# Patient Record
Sex: Female | Born: 1950 | Race: White | Hispanic: No | Marital: Married | Smoking: Never smoker
Health system: Southern US, Community
[De-identification: ages and names within clinical notes are randomized; demographics above are authoritative.]

## PROBLEM LIST (undated history)

## (undated) DIAGNOSIS — J189 Pneumonia, unspecified organism: Secondary | ICD-10-CM

---

## 2003-02-14 ENCOUNTER — Encounter (INDEPENDENT_AMBULATORY_CARE_PROVIDER_SITE_OTHER): Payer: Self-pay | Admitting: Specialist

## 2003-02-14 ENCOUNTER — Ambulatory Visit (HOSPITAL_COMMUNITY): Admission: RE | Admit: 2003-02-14 | Discharge: 2003-02-14 | Payer: Self-pay | Admitting: Gastroenterology

## 2007-04-05 ENCOUNTER — Encounter: Admission: RE | Admit: 2007-04-05 | Discharge: 2007-04-05 | Payer: Self-pay | Admitting: Internal Medicine

## 2010-07-05 ENCOUNTER — Encounter: Payer: Self-pay | Admitting: Family Medicine

## 2010-10-31 NOTE — Op Note (Signed)
   NAME:  Ashley Gutierrez, Ashley Gutierrez                           ACCOUNT NO.:  [id number]   MEDICAL RECORD NO.:  [id number]                   PATIENT TYPE:  AMB   LOCATION:  ENDO                                 FACILITY:  [facility]   PHYSICIAN:  Graylin Shiver, M.D.                DATE OF BIRTH:  Jul 06, 1950   DATE OF PROCEDURE:  02/14/2003  DATE OF DISCHARGE:                                 OPERATIVE REPORT   PROCEDURE PERFORMED:  Colonoscopy with biopsy.   INDICATIONS FOR PROCEDURE:  Screening.   Informed consent was obtained after explanation of the risks of bleeding,  infection and perforation.   PREMEDICATION:  Fentanyl 100 mcg IV, Versed 8 mg IV.   DESCRIPTION OF PROCEDURE:  With the patient in the left lateral decubitus  position, a rectal exam was performed and no masses were felt.  The Olympus  colonoscope was inserted into the rectum and advanced around the colon to  the cecum.  The cecal landmarks were identified.  I the cecum, there was a  small 3 mm polyp removed with cold forceps.  The ascending colon was normal.  The transverse colon normal.  The descending colon, sigmoid and rectum were  normal.  She tolerated the procedure well without complications.   IMPRESSION:  Small cecal polyp.                                                Graylin Shiver, M.D.    Germain Osgood  D:  02/14/2003  T:  02/14/2003  Job:  [id number]   cc:   Bertram Gala, M.D.

## 2011-06-26 ENCOUNTER — Encounter (HOSPITAL_COMMUNITY): Payer: Self-pay | Admitting: *Deleted

## 2011-06-26 ENCOUNTER — Emergency Department (INDEPENDENT_AMBULATORY_CARE_PROVIDER_SITE_OTHER)

## 2011-06-26 ENCOUNTER — Emergency Department (INDEPENDENT_AMBULATORY_CARE_PROVIDER_SITE_OTHER)
Admission: EM | Admit: 2011-06-26 | Discharge: 2011-06-26 | Disposition: A | Discharge status: Home / Self Care | Source: Home / Self Care | Attending: Emergency Medicine | Admitting: Emergency Medicine

## 2011-06-26 DIAGNOSIS — J159 Unspecified bacterial pneumonia: Secondary | ICD-10-CM

## 2011-06-26 MED ORDER — IBUPROFEN 800 MG PO TABS
ORAL_TABLET | ORAL | Status: AC
  Filled 2011-06-26: qty 1

## 2011-06-26 MED ORDER — TRAMADOL HCL 50 MG PO TABS: 100.0000 mg | ORAL_TABLET | Freq: Three times a day (TID) | ORAL | Status: DC | PRN

## 2011-06-26 MED ORDER — GUAIFENESIN-CODEINE 100-10 MG/5ML PO SYRP: 10.0000 mL | ORAL_SOLUTION | Freq: Four times a day (QID) | ORAL | Status: DC | PRN

## 2011-06-26 MED ORDER — CEFTRIAXONE SODIUM 1 G IJ SOLR
INTRAMUSCULAR | Status: AC
  Filled 2011-06-26: qty 10

## 2011-06-26 MED ORDER — IBUPROFEN 800 MG PO TABS
800.0000 mg | ORAL_TABLET | Freq: Once | ORAL | Status: AC
  Administered 2011-06-26: 800 mg via ORAL

## 2011-06-26 MED ORDER — LIDOCAINE HCL (PF) 1 % IJ SOLN
INTRAMUSCULAR | Status: AC
  Filled 2011-06-26: qty 5

## 2011-06-26 MED ORDER — ALBUTEROL SULFATE HFA 108 (90 BASE) MCG/ACT IN AERS: 1.0000 | INHALATION_SPRAY | Freq: Four times a day (QID) | RESPIRATORY_TRACT | Status: DC | PRN

## 2011-06-26 MED ORDER — CLARITHROMYCIN 500 MG PO TABS: 500.0000 mg | ORAL_TABLET | Freq: Two times a day (BID) | ORAL | Status: DC

## 2011-06-26 MED ORDER — CEFTRIAXONE SODIUM 1 G IJ SOLR
1.0000 g | Freq: Once | INTRAMUSCULAR | Status: AC
  Administered 2011-06-26: 1 g via INTRAMUSCULAR

## 2011-06-26 NOTE — ED Notes (Signed)
Crackers and PO fluids provided.

## 2011-06-26 NOTE — ED Notes (Signed)
Reports having head cold and congestion last week; on Saturday started w/ general malaise and cough.  Fevers up to 102.9 on Sunday.  Had started to improve some over past few days, but now running fevers up to 102 again today.  Has been taking IBU (last dose @ 1400 today), ASA, and Mucinex D.  Denies n/v/d.

## 2011-06-26 NOTE — ED Provider Notes (Signed)
Chief Complaint  Patient presents with  . Cough  . Fever    History of Present Illness:  Ashley Gutierrez has had a one-week history of cough productive of yellow sputum, fever, chills, sweats, aches, pain in the right lateral chest, nasal congestion, rhinorrhea, clear to yellowish drainage with blood and headache. He denies any sore throat. No exposure to influenza. A prior history of pneumonia.  Review of Systems:  Other than noted above, the patient denies any of the following symptoms. Systemic:  No fever, chills, sweats, fatigue, myalgias, headache, or anorexia. Eye:  No redness, pain or drainage. ENT:  No earache, nasal congestion, rhinorrhea, sinus pressure, or sore throat. Lungs:  No cough, sputum production, wheezing, shortness of breath. Or chest pain. GI:  No nausea, vomiting, abdominal pain or diarrhea. Skin:  No rash or itching.  PMFSH:  Past medical history, family history, social history, meds, and allergies were reviewed.  Physical Exam:   Vital signs:  BP 166/94  Pulse 111  Temp(Src) 99.9 F (37.7 C) (Oral)  Resp 24  SpO2 95% General:  Alert, in no distress. Eye:  No conjunctival injection or drainage. ENT:  TMs and canals were normal, without erythema or inflammation.  Nasal mucosa was clear and uncongested, without drainage.  Mucous membranes were moist.  Pharynx was clear, without exudate or drainage.  There were no oral ulcerations or lesions. Neck:  Supple, no adenopathy, tenderness or mass. Lungs:  No respiratory distress. She had bilateral inspiratory and expiratory rhonchi, no wheezes or rales.  Breath sounds were otherwise clear and equal bilaterally. Heart:  Regular rhythm, without gallops, murmers or rubs. Skin:  Clear, warm, and dry, without rash or lesions.   Radiology:  Dg Chest 2 View  06/26/2011  *RADIOLOGY REPORT*  Clinical Data: Cough and fever for 1 week.  CHEST - 2 VIEW  Comparison: None.  Findings: The heart size is normal.  There is dense left lower lobe  opacity, obscuring the medial hemidiaphragm.  There are bilateral pleural effusions, right greater than left.  Minimal right lower lobe atelectasis or infiltrate.  No pulmonary edema.  IMPRESSION:  1.  Left lower lobe infiltrate. 2.  Bilateral effusions.  Original Report Authenticated By: Patterson Hammersmith, M.D.    Medications given in [facility]:  She was given Rocephin 1 g IM and tolerated this well.  Assessment:   Diagnoses that have been ruled out:  None  Diagnoses that are still under consideration:  None  Final diagnoses:  Community acquired bacterial pneumonia      Plan:   1.  The following meds were prescribed:   New Prescriptions   ALBUTEROL (PROVENTIL HFA;VENTOLIN HFA) 108 (90 BASE) MCG/ACT INHALER    Inhale 1-2 puffs into the lungs every 6 (six) hours as needed for wheezing.   CLARITHROMYCIN (BIAXIN) 500 MG TABLET    Take 1 tablet (500 mg total) by mouth 2 (two) times daily.   TRAMADOL (ULTRAM) 50 MG TABLET    Take 2 tablets (100 mg total) by mouth every 8 (eight) hours as needed for pain.   2.  The patient was instructed in symptomatic care and handouts were given. 3.  The patient was told to return if becoming worse in any way, if no better in 3 or 4 days, and given some red flag symptoms that would indicate earlier return. 4.   She was told to return in 48 hours for recheck. If she should become worse in the meantime I suggested she go straight  to the emergency room.   Roque Lias, MD 06/26/11 [phone]

## 2011-06-29 ENCOUNTER — Emergency Department (INDEPENDENT_AMBULATORY_CARE_PROVIDER_SITE_OTHER)
Admission: EM | Admit: 2011-06-29 | Discharge: 2011-06-29 | Disposition: A | Discharge status: Home / Self Care | Source: Home / Self Care | Attending: Family Medicine | Admitting: Family Medicine

## 2011-06-29 ENCOUNTER — Encounter (HOSPITAL_COMMUNITY): Payer: Self-pay

## 2011-06-29 ENCOUNTER — Telehealth: Payer: Self-pay | Admitting: Emergency Medicine

## 2011-06-29 ENCOUNTER — Emergency Department (INDEPENDENT_AMBULATORY_CARE_PROVIDER_SITE_OTHER)

## 2011-06-29 DIAGNOSIS — J189 Pneumonia, unspecified organism: Secondary | ICD-10-CM

## 2011-06-29 HISTORY — DX: Pneumonia, unspecified organism: J18.9

## 2011-06-29 LAB — DIFFERENTIAL
Basophils Absolute: 0 10*3/uL (ref 0.0–0.1)
Basophils Relative: 0 % (ref 0–1)
Eosinophils Absolute: 0 10*3/uL (ref 0.0–0.7)
Eosinophils Relative: 0 % (ref 0–5)
Lymphocytes Relative: 8 % — ABNORMAL LOW (ref 12–46)
Lymphs Abs: 0.9 10*3/uL (ref 0.7–4.0)
Monocytes Absolute: 0.5 10*3/uL (ref 0.1–1.0)
Monocytes Relative: 5 % (ref 3–12)
Neutro Abs: 9.8 10*3/uL — ABNORMAL HIGH (ref 1.7–7.7)
Neutrophils Relative %: 87 % — ABNORMAL HIGH (ref 43–77)

## 2011-06-29 LAB — CBC
HCT: 40.4 % (ref 36.0–46.0)
Hemoglobin: 14 g/dL (ref 12.0–15.0)
MCH: 30.1 pg (ref 26.0–34.0)
MCHC: 34.7 g/dL (ref 30.0–36.0)
MCV: 86.9 fL (ref 78.0–100.0)
Platelets: 437 10*3/uL — ABNORMAL HIGH (ref 150–400)
RBC: 4.65 MIL/uL (ref 3.87–5.11)
RDW: 12.3 % (ref 11.5–15.5)
WBC: 11.2 10*3/uL — ABNORMAL HIGH (ref 4.0–10.5)

## 2011-06-29 MED ORDER — OSELTAMIVIR PHOSPHATE 75 MG PO CAPS: 75.0000 mg | ORAL_CAPSULE | Freq: Two times a day (BID) | ORAL | Status: AC

## 2011-06-29 MED ORDER — MOXIFLOXACIN HCL 400 MG PO TABS: 400.0000 mg | ORAL_TABLET | Freq: Every day | ORAL | Status: AC

## 2011-06-29 NOTE — Telephone Encounter (Signed)
Per Lawson Fiscal she will speak with RB regarding this

## 2011-06-29 NOTE — ED Provider Notes (Signed)
History     CSN: [id number]  Arrival date & time 06/29/11  1059   First MD Initiated Contact with Patient 06/29/11 1314      Chief Complaint  Patient presents with  . Follow-up    (Consider location/radiation/quality/duration/timing/severity/associated sxs/prior treatment) Patient is a 61 y.o. female presenting with cough. The history is provided by the patient and the spouse.  Cough This is a new problem. The current episode started more than 2 days ago (seen 1/11 for pneumonia, pt not feeling much better but fever and chills gone, still cough, decreased po, , sob.). The problem occurs constantly. The problem has not changed since onset.The cough is productive of purulent sputum. There has been no fever. Associated symptoms include chest pain and shortness of breath. Pertinent negatives include no chills and no sore throat. She is not a smoker. Her past medical history is significant for pneumonia.    Past Medical History  Diagnosis Date  . Pneumonia     History reviewed. No pertinent past surgical history.  No family history on file.  History  Substance Use Topics  . Smoking status: Never Smoker   . Smokeless tobacco: Not on file  . Alcohol Use: No    OB History    Grav Para Term Preterm Abortions TAB SAB Ect Mult Living                  Review of Systems  Constitutional: Positive for appetite change. Negative for fever and chills.  HENT: Negative for sore throat.   Respiratory: Positive for cough and shortness of breath.   Cardiovascular: Positive for chest pain.  Gastrointestinal: Positive for nausea.    Allergies  Review of patient's allergies indicates no known allergies.  Home Medications   Current Outpatient Rx  Name Route Sig Dispense Refill  . ALBUTEROL SULFATE HFA 108 (90 BASE) MCG/ACT IN AERS Inhalation Inhale 1-2 puffs into the lungs every 6 (six) hours as needed for wheezing. 1 Inhaler 0  . TRAMADOL HCL 50 MG PO TABS Oral Take 2 tablets (100 mg  total) by mouth every 8 (eight) hours as needed for pain. 30 tablet 0  . MOXIFLOXACIN HCL 400 MG PO TABS Oral Take 1 tablet (400 mg total) by mouth daily. 7 tablet 0  . OSELTAMIVIR PHOSPHATE 75 MG PO CAPS Oral Take 1 capsule (75 mg total) by mouth every 12 (twelve) hours. 10 capsule 0    BP 150/89  Pulse 100  Temp(Src) 98.4 F (36.9 C) (Oral)  Resp 18  SpO2 96%  Physical Exam  Nursing note and vitals reviewed. Constitutional: She appears well-developed and well-nourished. No distress.  Neck: Normal range of motion. Neck supple.  Cardiovascular: Normal rate, regular rhythm, normal heart sounds and intact distal pulses.   Pulmonary/Chest: She has decreased breath sounds in the right lower field, the left middle field and the left lower field. She has rales in the right lower field, the left middle field and the left lower field. She exhibits tenderness.  Lymphadenopathy:    She has no cervical adenopathy.    ED Course  Procedures (including critical care time)  Labs Reviewed  CBC - Abnormal; Notable for the following:    WBC 11.2 (*)    Platelets 437 (*)    All other components within normal limits  DIFFERENTIAL - Abnormal; Notable for the following:    Neutrophils Relative 87 (*)    Neutro Abs 9.8 (*)    Lymphocytes Relative 8 (*)  All other components within normal limits   Dg Chest 2 View  06/29/2011  *RADIOLOGY REPORT*  Clinical Data: Pneumonia.  Clinical worsening.  CHEST - 2 VIEW  Comparison: 06/26/2011  Findings: Infiltrate and collapse in the left lower lobe appear quite similar.  The left upper lobe is clear.  There may be mild patchy involvement at the right base.  Alternatively, findings at the right base could relate to scarring.  No new or progressive findings radiographically.  IMPRESSION: No radiographic change.  Left lower lobe pneumonia and volume loss. Mild patchy density at the right base could be scarring or minimal involvement in the right lower lobe as well.   Original Report Authenticated By: Thomasenia Sales, M.D.     1. CAP (community acquired pneumonia)       MDM  X-rays reviewed and report per radiologist.  Plan per [facility] pulm.        Barkley Bruns, MD 06/29/11 [phone]

## 2011-06-29 NOTE — ED Notes (Signed)
Pt seen here on Friday and diagnosed with pneumonia.  States she doesn't feel much better- still coughing and feels weak.  States she hasn't been eating and has been trying to drink fluids.  Still coughing but denies fever or chills.

## 2011-07-02 NOTE — Telephone Encounter (Signed)
Dr. Delton Coombes does not recall speaking with Dr. Artis Flock regarding this patient. She will need to be scheduled for first available consult with any provider if still needing appointment.

## 2011-07-02 NOTE — Telephone Encounter (Signed)
Pt returned call. I have scheduled her to see MW tomorrow at 3:30 (tried for the 1:30 slot but she can't come until spouse gets off of work). Ashley Gutierrez

## 2011-07-02 NOTE — Telephone Encounter (Signed)
ATC pt at home # to offer her a pulmonary consult appt with first avail doc (MW added office tomorrow afternoon so I was going to offer her a consult appt tomorrow with MW)  However, family member picked up the phone to hand it to pt and she hung up on me.  Will try back again later today.

## 2011-07-03 ENCOUNTER — Ambulatory Visit (INDEPENDENT_AMBULATORY_CARE_PROVIDER_SITE_OTHER): Admitting: Internal Medicine

## 2011-07-03 ENCOUNTER — Encounter: Payer: Self-pay | Admitting: Internal Medicine

## 2011-07-03 VITALS — BP 156/92 | HR 90 | Temp 98.3°F | Ht 67.0 in | Wt 123.6 lb

## 2011-07-03 DIAGNOSIS — J189 Pneumonia, unspecified organism: Secondary | ICD-10-CM

## 2011-07-03 NOTE — Patient Instructions (Signed)
Return on Jan 30th for follow up cxr - call sooner if condition worsens.

## 2011-07-03 NOTE — Assessment & Plan Note (Signed)
cxr shows minimal vol loss (which would be more of a concern if she were a smoker) and very little pleural effusion, which suggests risk of late parapneumic process; however, I am hopeful that this is nothing more than a CAP with a classic cxr lag.  Discussed in detail all the  indications, usual  risks and alternatives  relative to the benefits with patient who agrees to proceed with conservative rx in the absence of any flare of symptoms with a final cxr on 07/15/11 (placed in tickle file for this purpose)

## 2011-07-03 NOTE — Progress Notes (Signed)
  Subjective:    Patient ID: Ashley Gutierrez, female    DOB: [birth year]   MRN: [id number]  HPI  54 yowf never smoker never resp problems until christmas 2012 with cold evolving to severe cough and cp and dx of pna referred by Dr Alanson Puls 07/03/2011 for pna.  07/03/2011 1s pulmonary eval cc acute onset cold like symptoms around Christmas 2012 then severe cough then fever to 102 the first week in January seen in [facility] rx inhaler, biaxin, shot, pain killers with worsening bilateral cp really no better (x fever gone)  then changed 1/14 to avelox and tamiflu and 3 days Prior to day of OV  > recovered x a bit weak still. Pain is better, fever gone, no cough. No arthralgias, appetite returning.   Sleeping ok without nocturnal  or early am exacerbation  of respiratory  C/o's . Also denies any obvious fluctuation of symptoms with weather or environmental changes or other aggravating or alleviating factors except as outlined above    Review of Systems  Constitutional: Positive for fever and unexpected weight change.  HENT: Positive for congestion, rhinorrhea and postnasal drip. Negative for ear pain, nosebleeds, sore throat, sneezing, trouble swallowing, dental problem and sinus pressure.   Eyes: Negative for redness and itching.  Respiratory: Positive for cough. Negative for chest tightness, shortness of breath and wheezing.   Cardiovascular: Negative for palpitations and leg swelling.  Gastrointestinal: Negative for nausea and vomiting.  Genitourinary: Negative for dysuria.  Musculoskeletal: Negative for joint swelling.  Skin: Positive for rash.  Neurological: Negative for headaches.  Hematological: Does not bruise/bleed easily.  Psychiatric/Behavioral: Negative for dysphoric mood. The patient is not nervous/anxious.        Objective:   Physical Exam  Pleasant amb wf nad  Wt 123 07/03/2011   HEENT: nl dentition, turbinates, and orophanx. Nl external ear canals without cough reflex   NECK :   without JVD/Nodes/TM/ nl carotid upstrokes bilaterally   LUNGS: no acc muscle use, decreased bs both bases without bronchial changes or crackles  CV:  RRR  no s3 or murmur or increase in P2, no edema   ABD:  soft and nontender with nl excursion in the supine position. No bruits or organomegaly, bowel sounds nl  MS:  warm without deformities, calf tenderness, cyanosis or clubbing  SKIN: warm and dry without lesions    NEURO:  alert, approp, no deficits    06/29/11 CXR No radiographic change. Left lower lobe pneumonia and volume loss.  Mild patchy density at the right base could be scarring or minimal  involvement in the right lower lobe as well.     Assessment & Plan:

## 2011-07-15 ENCOUNTER — Ambulatory Visit: Admitting: Internal Medicine

## 2011-07-16 ENCOUNTER — Ambulatory Visit (INDEPENDENT_AMBULATORY_CARE_PROVIDER_SITE_OTHER)
Admission: RE | Admit: 2011-07-16 | Discharge: 2011-07-16 | Disposition: A | Discharge status: Home / Self Care | Source: Ambulatory Visit | Attending: Internal Medicine | Admitting: Internal Medicine

## 2011-07-16 ENCOUNTER — Ambulatory Visit (INDEPENDENT_AMBULATORY_CARE_PROVIDER_SITE_OTHER): Admitting: Internal Medicine

## 2011-07-16 ENCOUNTER — Encounter: Payer: Self-pay | Admitting: Internal Medicine

## 2011-07-16 VITALS — BP 150/86 | HR 76 | Temp 98.0°F | Ht 67.0 in | Wt 123.0 lb

## 2011-07-16 DIAGNOSIS — J189 Pneumonia, unspecified organism: Secondary | ICD-10-CM

## 2011-07-16 NOTE — Progress Notes (Signed)
  Subjective:    Patient ID: Ashley Gutierrez, female    DOB: 06-26-1950   MRN: [id number]  HPI  Brief patient profile:  73 yowf never smoker never resp problems until christmas 2012 with cold evolving to severe cough and cp and dx of pna referred by Dr Alanson Puls 07/03/2011 for pna.  07/03/2011 1s pulmonary eval cc acute onset cold like symptoms around Christmas 2012 then severe cough then fever to 102 the first week in January seen in [facility] rx inhaler, biaxin, shot, pain killers with worsening bilateral cp really no better (x fever gone)  then changed 1/14 to avelox and tamiflu and 3 days Prior to day of OV  > recovered x a bit weak still. Pain is better, fever gone, no cough. No arthralgias, appetite returning.  rec Finish all your abx Return on Jan 30th for follow up cxr - call sooner if condition worsens   07/16/2011 f/u ov/Athanasia Stanwood cc no cough at all, some discomfort with deep breathing bilaterally laterally but no sob   Sleeping ok without nocturnal  or early am exacerbation  of respiratory  C/o's . Also denies any obvious fluctuation of symptoms with weather or environmental changes or other aggravating or alleviating factors except as outlined above.  ROS  At present neg for  any significant sore throat, dysphagia, itching, sneezing,  nasal congestion or excess/ purulent secretions,  fever, chills, sweats, unintended wt loss,  exertional cp, hempoptysis, orthopnea pnd or leg swelling.  Also denies presyncope, palpitations, heartburn, abdominal pain, nausea, vomiting, diarrhea  or change in bowel or urinary habits, dysuria,hematuria,  rash, arthralgias, visual complaints, headache, numbness weakness or ataxia.            Objective:   Physical Exam  Pleasant amb wf nad   Wt 123 07/03/2011 > 07/16/2011  123   HEENT: nl dentition, turbinates, and orophanx. Nl external ear canals without cough reflex   NECK :  without JVD/Nodes/TM/ nl carotid upstrokes bilaterally   LUNGS: no acc muscle use,  decreased bs both bases without bronchial changes or crackles  CV:  RRR  no s3 or murmur or increase in P2, no edema   ABD:  soft and nontender with nl excursion in the supine position. No bruits or organomegaly, bowel sounds nl  MS:  warm without deformities, calf tenderness, cyanosis or clubbing      06/29/11 CXR No radiographic change. Left lower lobe pneumonia and volume loss.  Mild patchy density at the right base could be scarring or minimal  involvement in the right lower lobe as well.     Assessment & Plan:

## 2011-07-16 NOTE — Patient Instructions (Signed)
Return here if not staying 100% better  Classic subdiaphragmatic pain pattern suggests ibs:  Stereotypical, migratory with a very limited distribution of pain locations, daytime, not exacerbated by ex or coughing, worse in sitting position, associated with generalized abd bloating, not present supine due to the dome effect of the diaphragm is  canceled in that position. Frequently these patients have had multiple negative GI workups and CT scans.  Treatment consists of avoiding foods that cause gas (especially beans and raw vegetables like spinach and salads)  and citrucel 1 heaping tsp twice daily with a large glass of water.  Pain should improve w/in 2 weeks and if not then consider further GI work up.   Gas X also may help.

## 2011-07-17 ENCOUNTER — Encounter: Payer: Self-pay | Admitting: Internal Medicine

## 2011-07-17 NOTE — Assessment & Plan Note (Addendum)
Marked clinical and radiographic improvement p appropriate rx for pna though clinical course a bit longer than usual.  She has some vague lower chest discomfort which may be nothing more than ibs but certainly needs further evaluation if not resolving over the next few weeks.  She is a never smoker so as long as all her symptoms completely resolved there's no need for f/u at this point

## 2011-07-22 ENCOUNTER — Telehealth: Payer: Self-pay | Admitting: *Deleted

## 2011-07-22 NOTE — Telephone Encounter (Signed)
Error Pt has already had cxr done 07/15/10 and the results were discussed at ov.

## 2011-07-22 NOTE — Telephone Encounter (Signed)
Message copied by Christen Butter on Wed Jul 22, 2011  3:26 PM ------      Message from: Nyoka Cowden      Created: Fri Jul 03, 2011  4:17 PM       Needs cxr by now

## 2011-07-27 ENCOUNTER — Telehealth: Payer: Self-pay | Admitting: Internal Medicine

## 2011-07-27 NOTE — Telephone Encounter (Signed)
More likely chest wall muscle sprain than rib fx but no additional treatment needed for either unless pain gets worse but would need ov if this is the case

## 2011-07-27 NOTE — Telephone Encounter (Signed)
Pt advised of recs.Jennifer Castillo, CMA  

## 2011-07-27 NOTE — Telephone Encounter (Signed)
Called and spoke with pt.  Pt states she was seen by MW on 07/16/11 for f/u PNA and had cxr done same day.  Pt states she is still having Bilat "soreness" across her chest/ribs.  States left side of chest is worse- sore to the touch.  Pt is wondering if this could still be the pna or maybe a cracked rib.  MW, please advise.  Thanks!!

## 2015-03-26 DIAGNOSIS — Z86010 Personal history of colonic polyps: Secondary | ICD-10-CM | POA: Diagnosis not present

## 2015-03-26 DIAGNOSIS — Z09 Encounter for follow-up examination after completed treatment for conditions other than malignant neoplasm: Secondary | ICD-10-CM | POA: Diagnosis not present

## 2015-03-26 LAB — HM COLONOSCOPY

## 2016-04-29 ENCOUNTER — Ambulatory Visit (INDEPENDENT_AMBULATORY_CARE_PROVIDER_SITE_OTHER): Payer: Medicare PPO | Admitting: Family Medicine

## 2016-04-29 ENCOUNTER — Encounter: Payer: Self-pay | Admitting: Family Medicine

## 2016-04-29 DIAGNOSIS — Z83438 Family history of other disorder of lipoprotein metabolism and other lipidemia: Secondary | ICD-10-CM | POA: Insufficient documentation

## 2016-04-29 DIAGNOSIS — E559 Vitamin D deficiency, unspecified: Secondary | ICD-10-CM | POA: Diagnosis not present

## 2016-04-29 DIAGNOSIS — E785 Hyperlipidemia, unspecified: Secondary | ICD-10-CM | POA: Diagnosis not present

## 2016-04-29 DIAGNOSIS — Z8371 Family history of colonic polyps: Secondary | ICD-10-CM

## 2016-04-29 DIAGNOSIS — R030 Elevated blood-pressure reading, without diagnosis of hypertension: Secondary | ICD-10-CM | POA: Diagnosis not present

## 2016-04-29 DIAGNOSIS — Z8249 Family history of ischemic heart disease and other diseases of the circulatory system: Secondary | ICD-10-CM | POA: Diagnosis not present

## 2016-04-29 DIAGNOSIS — Z8349 Family history of other endocrine, nutritional and metabolic diseases: Secondary | ICD-10-CM | POA: Diagnosis not present

## 2016-04-29 NOTE — Progress Notes (Addendum)
New patient office visit note:  Impression and Recommendations:    1. Elevated blood pressure reading without diagnosis of hypertension   2. Family history of hypertension   3. Family history of hyperlipidemia   4. Family history of colonic polyps    -  health counseling done;   -  advised pt to monitor BP at home and keep a log.  F/up sooner than planned if BP remains above goal of UNDER 130/80 on regular basis  -   Handouts and educational materials given to pt.   -  told pt she can f/up for discussion of labs if she wants to; o/w we will have my assistant call her with results.   - f/up 60mo needed minimum for CPE   Orders Placed This Encounter  Procedures  . COMPLETE METABOLIC PANEL WITH GFR  . CBC with Differential/Platelet  . Hemoglobin A1c  . Lipid panel  . T4, free  . TSH  . VITAMIN D 25 Hydroxy (Vit-D Deficiency, Fractures)  . Vitamin B12   The patient was counseled, risk factors were discussed, anticipatory guidance given.  Return for fasting BW will be done today;  f/up if desired to discuss it; o/w yrly PE needed.  Please see AVS handed out to patient at the end of our visit for further patient instructions/ counseling done pertaining to today's office visit.    Note: This document was prepared using Dragon voice recognition software and may include unintentional dictation errors.  ----------------------------------------------------------------------------------------------------------------------    Subjective:    Chief Complaint  Patient presents with  . Establish Care    HPI: Ashley Gutierrez is a pleasant 65 y.o. female who presents to [place] [facility] today to review their medical history with me and establish care.   I asked the patient to review their chronic problem list with me to ensure everything was updated and accurate.    Prior PCP- Dr Glendale Chard- [facility]  Retired- used to work in [employer], husband  in [employer]  Married 57 yrs- [place].  1 kid (57yo, 4 Grandkids)  She helps takes care of them as well. [place]ived here 68yrs now.   [place] 4 [place]- about 38min.  Never smoked, no etoh, no drugs, not currently sexually active with husband.  Mom - HTN, HLD lived til age 40.   Never really goes to the doctor- no yrly screenings etc.  Had lifeline screening in Feb 2016.   GYn- none, doesn't know when last exam was- has no clue.   Last mammo- 2008, is not worried about it and understands yrly recs.  Was told Bp was a "little high in past", never been on meds and doesn't want them.       Wt Readings from Last 3 Encounters:  04/29/16 129 lb (58.5 kg)  07/16/11 123 lb (55.8 kg)  07/03/11 123 lb 9.6 oz (56.1 kg)   BP Readings from Last 3 Encounters:  04/29/16 (!) 162/82  07/16/11 (!) 150/86  07/03/11 (!) 156/92   Pulse Readings from Last 3 Encounters:  04/29/16 79  07/16/11 76  07/03/11 90   BMI Readings from Last 3 Encounters:  04/29/16 20.20 kg/m  07/16/11 19.26 kg/m  07/03/11 19.36 kg/m    Patient Care Team    Relationship Specialty Notifications Start End  Mellody Dance, DO PCP - General Family Medicine  04/29/16   Wonda Horner, MD Consulting Physician Gastroenterology  04/29/16   Tanda Rockers, MD Consulting Physician Pulmonary Disease  04/29/16     Patient Active Problem List   Diagnosis Date Noted  . Elevated blood pressure reading without diagnosis of hypertension 04/29/2016    Priority: High  . Family history of hypertension 04/29/2016  . Family history of hyperlipidemia 04/29/2016  . Family history of colonic polyps 04/29/2016     Past Medical History:  Diagnosis Date  . Pneumonia      Past Medical History:  Diagnosis Date  . Pneumonia      History reviewed. No pertinent surgical history.   Family History  Problem Relation Age of Onset  . Diabetes Mother   . Hypertension Mother   . Hyperlipidemia Mother      History  Drug Use  No    History  Alcohol Use No    History  Smoking Status  . Never Smoker  Smokeless Tobacco  . Never Used     Patient's Medications  New Prescriptions   No medications on file  Previous Medications   MULTIPLE VITAMIN (MULTIVITAMIN) CAPSULE    Take 1 capsule by mouth daily.   VITAMIN C (ASCORBIC ACID) 500 MG TABLET    Take 1,000 mg by mouth daily.   Modified Medications   No medications on file  Discontinued Medications   No medications on file    Allergies: Patient has no known allergies.  Review of Systems  Constitutional: Negative.  Negative for chills, diaphoresis, fever, malaise/fatigue and weight loss.  HENT: Negative.  Negative for congestion, sore throat and tinnitus.   Eyes: Negative.  Negative for blurred vision, double vision and photophobia.  Respiratory: Negative.  Negative for cough and wheezing.   Cardiovascular: Negative.  Negative for chest pain and palpitations.  Gastrointestinal: Negative.  Negative for blood in stool, diarrhea, nausea and vomiting.  Genitourinary: Negative.  Negative for dysuria, frequency and urgency.  Musculoskeletal: Negative.  Negative for joint pain and myalgias.  Skin: Negative.  Negative for itching and rash.  Neurological: Negative.  Negative for dizziness, focal weakness, weakness and headaches.  Endo/Heme/Allergies: Negative.  Negative for environmental allergies and polydipsia. Does not bruise/bleed easily.  Psychiatric/Behavioral: Negative.  Negative for depression and memory loss. The patient is not nervous/anxious and does not have insomnia.      Objective:    Blood pressure (!) 162/82, pulse 79, resp. rate 16, weight 129 lb (58.5 kg), SpO2 98 %. Body mass index is 20.2 kg/m. General: Well Developed, well nourished, and in no acute distress.  Neuro: Alert and oriented x3, extra-ocular muscles intact, sensation grossly intact.  HEENT: Normocephalic, atraumatic, pupils equal round reactive to light, neck supple, no  bruits Skin: no gross rashes  Cardiac: Regular rate and rhythm Respiratory: Essentially clear to auscultation bilaterally. Not using accessory muscles, speaking in full sentences.  Abdominal: not grossly distended Musculoskeletal: Ambulates w/o diff, FROM * 4 ext.  Vasc: less 2 sec cap RF, warm and pink  Psych:  No HI/SI, judgement and insight good, Euthymic mood. Full Affect.

## 2016-04-29 NOTE — Patient Instructions (Addendum)
Since her blood pressure is up today, please do some home blood pressure monitoring. Check your blood pressure after you been sitting for about 15-20 minutes a can check at random times during the day. We don't have to check it every day but just we wanted know what it is on average. Normal blood pressure should always be in the 120s over 70s or less. If it's 130/80 or more on a regular basis please return to the clinic sooner than planned so we can talk about treatment.    Hypertension Hypertension, commonly called high blood pressure, is when the force of blood pumping through your arteries is too strong. Your arteries are the blood vessels that carry blood from your heart throughout your body. A blood pressure reading consists of a higher number over a lower number, such as 110/72. The higher number (systolic) is the pressure inside your arteries when your heart pumps. The lower number (diastolic) is the pressure inside your arteries when your heart relaxes. Ideally you want your blood pressure below 120/80. Hypertension forces your heart to work harder to pump blood. Your arteries may become narrow or stiff. Having untreated or uncontrolled hypertension can cause heart attack, stroke, kidney disease, and other problems. What increases the risk? Some risk factors for high blood pressure are controllable. Others are not. Risk factors you cannot control include: Race. You may be at higher risk if you are African American. Age. Risk increases with age. Gender. Men are at higher risk than women before age 44 years. After age 72, women are at higher risk than men. Risk factors you can control include: Not getting enough exercise or physical activity. Being overweight. Getting too much fat, sugar, calories, or salt in your diet. Drinking too much alcohol. What are the signs or symptoms? Hypertension does not usually cause signs or symptoms. Extremely high blood pressure (hypertensive crisis) may  cause headache, anxiety, shortness of breath, and nosebleed. How is this diagnosed? To check if you have hypertension, your health care provider will measure your blood pressure while you are seated, with your arm held at the level of your heart. It should be measured at least twice using the same arm. Certain conditions can cause a difference in blood pressure between your right and left arms. A blood pressure reading that is higher than normal on one occasion does not mean that you need treatment. If it is not clear whether you have high blood pressure, you may be asked to return on a different day to have your blood pressure checked again. Or, you may be asked to monitor your blood pressure at home for 1 or more weeks. How is this treated? Treating high blood pressure includes making lifestyle changes and possibly taking medicine. Living a healthy lifestyle can help lower high blood pressure. You may need to change some of your habits. Lifestyle changes may include: Following the DASH diet. This diet is high in fruits, vegetables, and whole grains. It is low in salt, red meat, and added sugars. Keep your sodium intake below 2,300 mg per day. Getting at least 30-45 minutes of aerobic exercise at least 4 times per week. Losing weight if necessary. Not smoking. Limiting alcoholic beverages. Learning ways to reduce stress. Your health care provider may prescribe medicine if lifestyle changes are not enough to get your blood pressure under control, and if one of the following is true: You are 55-20 years of age and your systolic blood pressure is above 140. You are 60  years of age or older, and your systolic blood pressure is above 150. Your diastolic blood pressure is above 90. You have diabetes, and your systolic blood pressure is over [id number] or your diastolic blood pressure is over 90. You have kidney disease and your blood pressure is above 140/90. You have heart disease and your blood pressure is  above 140/90. Your personal target blood pressure may vary depending on your medical conditions, your age, and other factors. Follow these instructions at home: Have your blood pressure rechecked as directed by your health care provider. Take medicines only as directed by your health care provider. Follow the directions carefully. Blood pressure medicines must be taken as prescribed. The medicine does not work as well when you skip doses. Skipping doses also puts you at risk for problems. Do not smoke. Monitor your blood pressure at home as directed by your health care provider. Contact a health care provider if: You think you are having a reaction to medicines taken. You have recurrent headaches or feel dizzy. You have swelling in your ankles. You have trouble with your vision. Get help right away if: You develop a severe headache or confusion. You have unusual weakness, numbness, or feel faint. You have severe chest or abdominal pain. You vomit repeatedly. You have trouble breathing. This information is not intended to replace advice given to you by your health care provider. Make sure you discuss any questions you have with your health care provider. Document Released: 06/01/2005 Document Revised: 11/07/2015 Document Reviewed: 03/24/2013 Elsevier Interactive Patient Education  2017 [place] NOTE:  The office will be calling if your labs / recent test results are not within acceptable normal values within one week of them being done.    Furthermore, you'll be able to review all of your results in "My Chart," when they become available; so please sign-up if you have not already done so.   Keep a copy of these results in your personal home file to share with your other physicians as warranted. If you have any further questions or concerns please do not hesitate to contact us.   Thank you and we appreciate you choosing [place] as your primary care provider.   - 'the Team'  at [place]     Please realize, EXERCISE IS MEDICINE!  -  American Heart Association [facility]) guidelines for exercise : If you are in good health, without any medical conditions, you should engage in 150 minutes of moderate intensity aerobic activity per week.  This means you should be huffing and puffing throughout your workout.   Engaging in regular exercise will improve brain function and memory, as well as improve mood, boost immune system and help with weight management.  As well as the other, more well-known effects of exercise such as decreasing blood sugar levels, decreasing blood pressure,  and decreasing bad cholesterol levels/ increasing good cholesterol levels.     -  The AHA strongly endorses consumption of a diet that contains a variety of foods from all the food categories with an emphasis on fruits and vegetables; fat-free and low-fat dairy products; cereal and grain products; legumes and nuts; and fish, poultry, and/or extra lean meats.    Excessive food intake, especially of foods high in saturated and trans fats, sugar, and salt, should be avoided.    Adequate water intake of roughly 1/2 of your weight in pounds, should equal the ounces of water per day you should  drink.  So for instance, if you're 200 pounds, that would be 100 ounces of water per day.         Mediterranean Diet  Why follow it? Research shows. . Those who follow the Mediterranean diet have a reduced risk of heart disease  . The diet is associated with a reduced incidence of Parkinson's and Alzheimer's diseases . People following the diet may have longer life expectancies and lower rates of chronic diseases  . The Dietary Guidelines for Americans recommends the Mediterranean diet as an eating plan to promote health and prevent disease  What Is the Mediterranean Diet?  . Healthy eating plan based on typical foods and recipes of Mediterranean-style cooking . The diet is primarily a plant based  diet; these foods should make up a majority of meals   Starches - Plant based foods should make up a majority of meals - They are an important sources of vitamins, minerals, energy, antioxidants, and fiber - Choose whole grains, foods high in fiber and minimally processed items  - Typical grain sources include wheat, oats, barley, corn, brown rice, bulgar, farro, millet, polenta, couscous  - Various types of beans include chickpeas, lentils, fava beans, black beans, white beans   Fruits  Veggies - Large quantities of antioxidant rich fruits & veggies; 6 or more servings  - Vegetables can be eaten raw or lightly drizzled with oil and cooked  - Vegetables common to the traditional Mediterranean Diet include: artichokes, arugula, beets, broccoli, brussel sprouts, cabbage, carrots, celery, collard greens, cucumbers, eggplant, kale, leeks, lemons, lettuce, mushrooms, okra, onions, peas, peppers, potatoes, pumpkin, radishes, rutabaga, shallots, spinach, sweet potatoes, turnips, zucchini - Fruits common to the Mediterranean Diet include: apples, apricots, avocados, cherries, clementines, dates, figs, grapefruits, grapes, melons, nectarines, oranges, peaches, pears, pomegranates, strawberries, tangerines  Fats - Replace butter and margarine with healthy oils, such as olive oil, canola oil, and tahini  - Limit nuts to no more than a handful a day  - Nuts include walnuts, almonds, pecans, pistachios, pine nuts  - Limit or avoid candied, honey roasted or heavily salted nuts - Olives are central to the [employer] - can be eaten whole or used in a variety of dishes   Meats Protein - Limiting red meat: no more than a few times a month - When eating red meat: choose lean cuts and keep the portion to the size of deck of cards - Eggs: approx. 0 to 4 times a week  - Fish and lean poultry: at least 2 a week  - Healthy protein sources include, chicken, [place], lean beef, lamb - Increase intake of seafood  such as tuna, salmon, trout, mackerel, shrimp, scallops - Avoid or limit high fat processed meats such as sausage and bacon  Dairy - Include moderate amounts of low fat dairy products  - Focus on healthy dairy such as fat free yogurt, skim milk, low or reduced fat cheese - Limit dairy products higher in fat such as whole or 2% milk, cheese, ice cream  Alcohol - Moderate amounts of red wine is ok  - No more than 5 oz daily for women (all ages) and men older than age 37  - No more than 10 oz of wine daily for men younger than 67  Other - Limit sweets and other desserts  - Use herbs and spices instead of salt to flavor foods  - Herbs and spices common to the traditional Mediterranean Diet include: basil, bay leaves, chives, cloves, cumin,  fennel, garlic, lavender, marjoram, mint, oregano, parsley, pepper, rosemary, sage, savory, sumac, tarragon, thyme   It's not just a diet, it's a lifestyle:  . The Mediterranean diet includes lifestyle factors typical of those in the region  . Foods, drinks and meals are best eaten with others and savored . Daily physical activity is important for overall good health . This could be strenuous exercise like running and aerobics . This could also be more leisurely activities such as walking, housework, yard-work, or taking the stairs . Moderation is the key; a balanced and healthy diet accommodates most foods and drinks . Consider portion sizes and frequency of consumption of certain foods   Meal Ideas & Options:  . Breakfast:  o Whole wheat toast or whole wheat English muffins with peanut butter & hard boiled egg o Steel cut oats topped with apples & cinnamon and skim milk  o Fresh fruit: banana, strawberries, melon, berries, peaches  o Smoothies: strawberries, bananas, greek yogurt, peanut butter o Low fat greek yogurt with blueberries and granola  o Egg white omelet with spinach and mushrooms o Breakfast couscous: whole wheat couscous, apricots, skim  milk, cranberries  . Sandwiches:  o Hummus and grilled vegetables (peppers, zucchini, squash) on whole wheat bread   o Grilled chicken on whole wheat pita with lettuce, tomatoes, cucumbers or tzatziki  o Tuna salad on whole wheat bread: tuna salad made with greek yogurt, olives, red peppers, capers, green onions o Garlic rosemary lamb pita: lamb sauted with garlic, rosemary, salt & pepper; add lettuce, cucumber, greek yogurt to pita - flavor with lemon juice and black pepper  . Seafood:  o Mediterranean grilled salmon, seasoned with garlic, basil, parsley, lemon juice and black pepper o Shrimp, lemon, and spinach whole-grain pasta salad made with low fat greek yogurt  o Seared scallops with lemon orzo  o Seared tuna steaks seasoned salt, pepper, coriander topped with tomato mixture of olives, tomatoes, olive oil, minced garlic, parsley, green onions and cappers  . Meats:  o Herbed greek chicken salad with kalamata olives, cucumber, feta  o Red bell peppers stuffed with spinach, bulgur, lean ground beef (or lentils) & topped with feta   o Kebabs: skewers of chicken, tomatoes, onions, zucchini, squash  o [place] burgers: made with red onions, mint, dill, lemon juice, feta cheese topped with roasted red peppers . Vegetarian o Cucumber salad: cucumbers, artichoke hearts, celery, red onion, feta cheese, tossed in olive oil & lemon juice  o Hummus and whole grain pita points with a greek salad (lettuce, tomato, feta, olives, cucumbers, red onion) o Lentil soup with celery, carrots made with vegetable broth, garlic, salt and pepper  o Tabouli salad: parsley, bulgur, mint, scallions, cucumbers, tomato, radishes, lemon juice, olive oil, salt and pepper.

## 2016-04-30 LAB — VITAMIN B12: VITAMIN B 12: 725 pg/mL (ref 200–1100)

## 2016-04-30 LAB — CBC WITH DIFFERENTIAL/PLATELET
BASOS ABS: 59 {cells}/uL (ref 0–200)
BASOS PCT: 1 %
EOS PCT: 3 %
Eosinophils Absolute: 177 cells/uL (ref 15–500)
HCT: 44.1 % (ref 35.0–45.0)
Hemoglobin: 14.9 g/dL (ref 11.7–15.5)
LYMPHS PCT: 20 %
Lymphs Abs: 1180 cells/uL (ref 850–3900)
MCH: 30.3 pg (ref 27.0–33.0)
MCHC: 33.8 g/dL (ref 32.0–36.0)
MCV: 89.8 fL (ref 80.0–100.0)
MONOS PCT: 6 %
MPV: 10.5 fL (ref 7.5–12.5)
Monocytes Absolute: 354 cells/uL (ref 200–950)
NEUTROS ABS: 4130 {cells}/uL (ref 1500–7800)
Neutrophils Relative %: 70 %
PLATELETS: 269 10*3/uL (ref 140–400)
RBC: 4.91 MIL/uL (ref 3.80–5.10)
RDW: 13.8 % (ref 11.0–15.0)
WBC: 5.9 10*3/uL (ref 3.8–10.8)

## 2016-04-30 LAB — LIPID PANEL
Cholesterol: 212 mg/dL — ABNORMAL HIGH (ref <200)
HDL: 78 mg/dL (ref >50)
LDL CALC: 115 mg/dL — AB (ref <100)
TRIGLYCERIDES: 97 mg/dL (ref <150)
Total CHOL/HDL Ratio: 2.7 Ratio (ref <5.0)
VLDL: 19 mg/dL (ref <30)

## 2016-04-30 LAB — COMPLETE METABOLIC PANEL WITH GFR
ALBUMIN: 4.8 g/dL (ref 3.6–5.1)
ALK PHOS: 92 U/L (ref 33–130)
ALT: 19 U/L (ref 6–29)
AST: 20 U/L (ref 10–35)
BILIRUBIN TOTAL: 0.8 mg/dL (ref 0.2–1.2)
BUN: 13 mg/dL (ref 7–25)
CO2: 30 mmol/L (ref 20–31)
Calcium: 9.6 mg/dL (ref 8.6–10.4)
Chloride: 103 mmol/L (ref 98–110)
Creat: 0.7 mg/dL (ref 0.50–0.99)
Glucose, Bld: 97 mg/dL (ref 65–99)
Potassium: 4 mmol/L (ref 3.5–5.3)
Sodium: 142 mmol/L (ref 135–146)
TOTAL PROTEIN: 7.8 g/dL (ref 6.1–8.1)

## 2016-04-30 LAB — TSH: TSH: 1.51 m[IU]/L

## 2016-04-30 LAB — T4, FREE: Free T4: 1.2 ng/dL (ref 0.8–1.8)

## 2016-04-30 LAB — HEMOGLOBIN A1C
HEMOGLOBIN A1C: 5.3 % (ref <5.7)
Mean Plasma Glucose: 105 mg/dL

## 2016-04-30 LAB — VITAMIN D 25 HYDROXY (VIT D DEFICIENCY, FRACTURES): VIT D 25 HYDROXY: 20 ng/mL — AB (ref 30–100)

## 2016-05-19 DIAGNOSIS — E559 Vitamin D deficiency, unspecified: Secondary | ICD-10-CM | POA: Insufficient documentation

## 2017-05-28 ENCOUNTER — Ambulatory Visit: Payer: Medicare PPO

## 2017-05-31 ENCOUNTER — Ambulatory Visit (INDEPENDENT_AMBULATORY_CARE_PROVIDER_SITE_OTHER): Payer: Medicare Other | Admitting: Family Medicine

## 2017-05-31 VITALS — BP 138/76 | HR 82 | Temp 98.6°F

## 2017-05-31 DIAGNOSIS — Z23 Encounter for immunization: Secondary | ICD-10-CM

## 2017-05-31 NOTE — Progress Notes (Signed)
Patient requested the regular flu vaccine and not the high dose for her age. MPulliam, CMA/RT(R)

## 2018-03-28 ENCOUNTER — Other Ambulatory Visit: Payer: Self-pay

## 2018-03-28 ENCOUNTER — Other Ambulatory Visit (INDEPENDENT_AMBULATORY_CARE_PROVIDER_SITE_OTHER): Payer: Medicare Other

## 2018-03-28 ENCOUNTER — Encounter: Payer: Self-pay | Admitting: Family Medicine

## 2018-03-28 ENCOUNTER — Ambulatory Visit (INDEPENDENT_AMBULATORY_CARE_PROVIDER_SITE_OTHER): Payer: Medicare Other | Admitting: Family Medicine

## 2018-03-28 VITALS — BP 155/92 | HR 79 | Ht 67.0 in | Wt 125.4 lb

## 2018-03-28 DIAGNOSIS — E2839 Other primary ovarian failure: Secondary | ICD-10-CM | POA: Diagnosis not present

## 2018-03-28 DIAGNOSIS — Z23 Encounter for immunization: Secondary | ICD-10-CM

## 2018-03-28 DIAGNOSIS — Z1239 Encounter for other screening for malignant neoplasm of breast: Secondary | ICD-10-CM

## 2018-03-28 DIAGNOSIS — E559 Vitamin D deficiency, unspecified: Secondary | ICD-10-CM

## 2018-03-28 DIAGNOSIS — D229 Melanocytic nevi, unspecified: Secondary | ICD-10-CM | POA: Insufficient documentation

## 2018-03-28 DIAGNOSIS — Z0000 Encounter for general adult medical examination without abnormal findings: Secondary | ICD-10-CM

## 2018-03-28 MED ORDER — TETANUS-DIPHTH-ACELL PERTUSSIS 5-2.5-18.5 LF-MCG/0.5 IM SUSP: 0.5000 mL | Freq: Once | INTRAMUSCULAR | 0 refills | Status: AC

## 2018-03-28 NOTE — Progress Notes (Signed)
Medicare wellness examination  - besides her coming in for just a flu vaccine, I have not seen patient since 04/29/2016  Subjective:   Ashley Gutierrez is a 67 y.o. female who presents for an Initial Medicare Annual Wellness Visit.  illnesses and recent changes- -Pt states she didn't walk much this summer due to the [place] she used to go at least 3 times per week for around 30 min  -Mother had a polyp but it was not cancerous  -Pt does not remember when she had her last mammogram, but she had it at the [facility] in [place] she thinks -States she doesn't want to go because she "doesn't feel good about exposure to those x-rays and stuff"  -Denies family history of breast, uterine, cervical cancers  -Pt has never had a bone density test done  -Pt wants an appointment to discuss blood panel results  -Pt wants to do a yearly skin screening with dermatology- told her we can place referral  -Pt has scoliosis, "I've always had it"- has some back questions and concerns today. SHe wonders if she needs referral to PT or not.   -Pt is retired   Activities of [place] In your present state of health, do you have difficulty performing the following activities? 1- Driving - no 2- Managing money - no 3- Feeding yourself - no 4- Getting from the bed to the chair - no 5- Climbing a flight of stairs - no 6- Preparing food and eating - no 7- Bathing or showering - no 8- Getting dressed - no 9- Getting to the toilet - no 10- Using the toilet - no 11- Moving around from place to place - no  Patient states that she does feel safe at home.  Functional Status Survey: Is the patient deaf or have difficulty hearing?: No Does the patient have difficulty seeing, even when wearing glasses/contacts?: No Does the patient have difficulty concentrating, remembering, or making decisions?: No Does the patient have difficulty walking or climbing stairs?: No Does the patient have difficulty  dressing or bathing?: No Does the patient have difficulty doing errands alone such as visiting a doctor's office or shopping?: No    Fall Risk  03/28/2018  Falls in the past year? No   Depression screen PHQ 2/9 03/28/2018  Decreased Interest 0  Down, Depressed, Hopeless 0  PHQ - 2 Score 0  Altered sleeping 0  Tired, decreased energy 0  Change in appetite 0  Feeling bad or failure about yourself  0  Trouble concentrating 0  Moving slowly or fidgety/restless 0  Suicidal thoughts 0  PHQ-9 Score 0  Difficult doing work/chores Not difficult at all   Current Exercise Habits: Home exercise routine, Type of exercise: walking, Frequency (Times/Week): 7    Review of Systems    General:   Denies fever, chills, unexplained weight loss.  Optho/Auditory:   Denies visual changes, blurred vision/LOV Respiratory:   Denies SOB, DOE more than baseline levels.  Cardiovascular:   Denies chest pain, palpitations, new onset peripheral edema  Gastrointestinal:   Denies nausea, vomiting, diarrhea.  Genitourinary: Denies dysuria, freq/ urgency, flank pain or discharge from genitals.  Endocrine:     Denies hot or cold intolerance, polyuria, polydipsia. Musculoskeletal:   Denies unexplained myalgias, joint swelling, unexplained arthralgias, gait problems.  Skin:  Denies rash, suspicious lesions Neurological:     Denies dizziness, unexplained weakness, numbness  Psychiatric/Behavioral:   Denies mood changes, suicidal or homicidal ideations, hallucinations  Objective:    Today's Vitals   03/28/18 1109 03/28/18 1221  BP: (!) 160/79 (!) 155/92  Pulse: 79   SpO2: 99%   Weight: 125 lb 6.4 oz (56.9 kg)   Height: 5\' 7"  (1.702 m)    Body mass index is 19.64 kg/m.  Advanced Directives 04/29/2016  Does Patient Have a Medical Advance Directive? No  Would patient like information on creating a medical advance directive? No - patient declined information    Current Medications  (verified) Outpatient Encounter Medications as of 03/28/2018  Medication Sig  . Multiple Vitamin (MULTIVITAMIN) capsule Take 1 capsule by mouth daily.  . Tdap (BOOSTRIX) 5-2.5-18.5 LF-MCG/0.5 injection Inject 0.5 mLs into the muscle once for 1 dose.  . [DISCONTINUED] vitamin C (ASCORBIC ACID) 500 MG tablet Take 1,000 mg by mouth daily.    No facility-administered encounter medications on file as of 03/28/2018.     Allergies (verified) Patient has no known allergies.   History: Past Medical History:  Diagnosis Date  . Pneumonia    History reviewed. No pertinent surgical history. Family History  Problem Relation Age of Onset  . Diabetes Mother   . Hypertension Mother   . Hyperlipidemia Mother    Social History   Socioeconomic History  . Marital status: Married    Spouse name: Not on file  . Number of children: 1  . Years of education: Not on file  . Highest education level: Not on file  Occupational History  . Occupation: Retired    Comment: Financial controller  . Financial resource strain: Not on file  . Food insecurity:    Worry: Not on file    Inability: Not on file  . Transportation needs:    Medical: Not on file    Non-medical: Not on file  Tobacco Use  . Smoking status: Never Smoker  . Smokeless tobacco: Never Used  Substance and Sexual Activity  . Alcohol use: No  . Drug use: No  . Sexual activity: Not on file  Lifestyle  . Physical activity:    Days per week: Not on file    Minutes per session: Not on file  . Stress: Not on file  Relationships  . Social connections:    Talks on phone: Not on file    Gets together: Not on file    Attends religious service: Not on file    Active member of club or organization: Not on file    Attends meetings of clubs or organizations: Not on file    Relationship status: Not on file  Other Topics Concern  . Not on file  Social History Narrative  . Not on file    Tobacco Counseling Counseling given: Not  Answered   Activities of Daily Living In your present state of health, do you have any difficulty performing the following activities: 03/28/2018  Hearing? N  Vision? N  Difficulty concentrating or making decisions? N  Walking or climbing stairs? N  Dressing or bathing? N  Doing errands, shopping? N  Some recent data might be hidden     Immunizations and Health Maintenance Immunization History  Administered Date(s) Administered  . Influenza,inj,Quad PF,6+ Mos 05/31/2017   Health Maintenance Due  Topic Date Due  . TETANUS/TDAP  02/01/1969  . MAMMOGRAM  04/04/2009  . INFLUENZA VACCINE  01/13/2018    Patient Care Team: Mellody Dance, DO as PCP - General (Family Medicine) Wonda Horner, MD as Consulting Physician (Gastroenterology) Tanda Rockers, MD as Consulting  Physician (Pulmonary Disease)  Indicate any recent Medical Services you may have received from other than [facility] providers in the past year (date may be approximate).     Assessment:   This is a routine wellness examination for [place].  Hearing/Vision screen No exam data present  Dietary issues and exercise activities discussed: Current Exercise Habits: Home exercise routine, Type of exercise: walking, Frequency (Times/Week): 7  Goals   None    Depression Screen PHQ 2/9 Scores 03/28/2018  PHQ - 2 Score 0  PHQ- 9 Score 0    Fall Risk Fall Risk  03/28/2018  Falls in the past year? No    Is the patient's home free of loose throw rugs in walkways, pet beds, electrical cords, etc?   no  Patient has area rugs      Grab bars in the bathroom? no      Handrails on the stairs?   no       Adequate lighting?   yes  Timed Get Up and Go Performed passed  Cognitive Function:  6CIT Screen 03/28/2018  What Year? 0 points  What month? 0 points  What time? 0 points  Count back from 20 0 points  Months in reverse 0 points  Repeat phrase 2 points  Total Score 2        6CIT Screen 03/28/2018  What  Year? 0 points  What month? 0 points  What time? 0 points  Count back from 20 0 points  Months in reverse 0 points  Repeat phrase 2 points  Total Score 2    Screening Tests Health Maintenance  Topic Date Due  . TETANUS/TDAP  02/01/1969  . MAMMOGRAM  04/04/2009  . INFLUENZA VACCINE  01/13/2018  . DEXA SCAN  03/29/2019 (Originally 02/02/2015)  . Hepatitis C Screening  03/29/2019 (Originally [birth year])  . PNA vac Low Risk Adult (1 of 2 - PCV13) 03/29/2019 (Originally 02/02/2015)  . COLONOSCOPY  03/25/2025    Qualifies for Shingles Vaccine? Information was given to patient to verify with insurance covers.   Cancer Screenings: Lung: Low Dose CT Chest recommended if Age 33-80 years, 30 pack-year currently smoking OR have quit w/in 15years. Patient does not qualify. Breast: Up to date on Mammogram? No   Up to date of Bone Density/Dexa? No Colorectal: 03/26/15  Additional Screenings: Hepatitis C Screening:  Patient declined     Plan:    I have personally reviewed and noted the following in the patient's chart:   . Medical and social history . Use of alcohol, tobacco or illicit drugs  . Current medications and supplements . Functional ability and status . Nutritional status . Physical activity . Advanced directives . List of other physicians . Hospitalizations, surgeries, and ER visits in previous 12 months . Vitals . Screenings to include cognitive, depression, and falls . Referrals and appointments  -Explained the use of the discharge summary to help remind patients of changes  -Encouraged pt to contact her insurance for specific information about Medicare an insurance coverage -Discussed AHA guidelines of 150 - 300 min of moderate cardiovascualr exercise per week to help prevent heart attacks or strokes -Encouraged pt to return for HIV and Hepatitis C testing, Zoster, TDap and Flu vaccination  In addition, I have reviewed and discussed with patient certain preventive  protocols, quality metrics, and best practice recommendations. A written personalized care plan for preventive services as well as general preventive health recommendations were provided to patient.     Mellody Dance, DO  03/28/2018      

## 2018-03-28 NOTE — Patient Instructions (Addendum)
Ashley Gutierrez, please place a referral for mammogram, bone density,   -Please give patient information on Shingrix vaccine update tetanus, flu etc as needed.     Preventive Care for Adults, Female  A healthy lifestyle and preventive care can promote health and wellness. Preventive health guidelines for women include the following key practices.   A routine yearly physical is a good way to check with your health care provider about your health and preventive screening. It is a chance to share any concerns and updates on your health and to receive a thorough exam.   Visit your dentist for a routine exam and preventive care every 6 months. Brush your teeth twice a day and floss once a day. Good oral hygiene prevents tooth decay and gum disease.   The frequency of eye exams is based on your age, health, family medical history, use of contact lenses, and other factors. Follow your health care provider's recommendations for frequency of eye exams.   Eat a healthy diet. Foods like vegetables, fruits, whole grains, low-fat dairy products, and lean protein foods contain the nutrients you need without too many calories. Decrease your intake of foods high in solid fats, added sugars, and salt. Eat the right amount of calories for you.Get information about a proper diet from your health care provider, if necessary.   Regular physical exercise is one of the most important things you can do for your health. Most adults should get at least 150 minutes of moderate-intensity exercise (any activity that increases your heart rate and causes you to sweat) each week. In addition, most adults need muscle-strengthening exercises on 2 or more days a week.   Maintain a healthy weight. The body mass index (BMI) is a screening tool to identify possible weight problems. It provides an estimate of body fat based on height and weight. Your health care provider can find your BMI, and can help you achieve or maintain a healthy  weight.For adults 20 years and older:   - A BMI below 18.5 is considered underweight.   - A BMI of 18.5 to 24.9 is normal.   - A BMI of 25 to 29.9 is considered overweight.   - A BMI of 30 and above is considered obese.   Maintain normal blood lipids and cholesterol levels by exercising and minimizing your intake of trans and saturated fats.  Eat a balanced diet with plenty of fruit and vegetables. Blood tests for lipids and cholesterol should begin at age 96 and be repeated every 5 years minimum.  If your lipid or cholesterol levels are high, you are over 40, or you are at high risk for heart disease, you may need your cholesterol levels checked more frequently.Ongoing high lipid and cholesterol levels should be treated with medicines if diet and exercise are not working.   If you smoke, find out from your health care provider how to quit. If you do not use tobacco, do not start.   Lung cancer screening is recommended for adults aged 60-80 years who are at high risk for developing lung cancer because of a history of smoking. A yearly low-dose CT scan of the lungs is recommended for people who have at least a 30-pack-year history of smoking and are a current smoker or have quit within the past 15 years. A pack year of smoking is smoking an average of 1 pack of cigarettes a day for 1 year (for example: 1 pack a day for 30 years or 2 packs a day  for 15 years). Yearly screening should continue until the smoker has stopped smoking for at least 15 years. Yearly screening should be stopped for people who develop a health problem that would prevent them from having lung cancer treatment.   If you are pregnant, do not drink alcohol. If you are breastfeeding, be very cautious about drinking alcohol. If you are not pregnant and choose to drink alcohol, do not have more than 1 drink per day. One drink is considered to be 12 ounces (355 mL) of beer, 5 ounces (148 mL) of wine, or 1.5 ounces (44 mL) of  liquor.   Avoid use of street drugs. Do not share needles with anyone. Ask for help if you need support or instructions about stopping the use of drugs.   High blood pressure causes heart disease and increases the risk of stroke. Your blood pressure should be checked at least yearly.  Ongoing high blood pressure should be treated with medicines if weight loss and exercise do not work.   If you are 3-11 years old, ask your health care provider if you should take aspirin to prevent strokes.   Diabetes screening involves taking a blood sample to check your fasting blood sugar level. This should be done once every 3 years, after age 76, if you are within normal weight and without risk factors for diabetes. Testing should be considered at a younger age or be carried out more frequently if you are overweight and have at least 1 risk factor for diabetes.   Breast cancer screening is essential preventive care for women. You should practice "breast self-awareness."  This means understanding the normal appearance and feel of your breasts and may include breast self-examination.  Any changes detected, no matter how small, should be reported to a health care provider.  Women in their 43s and 30s should have a clinical breast exam (CBE) by a health care provider as part of a regular health exam every 1 to 3 years.  After age 40, women should have a CBE every year.  Starting at age 64, women should consider having a mammogram (breast X-ray test) every year.  Women who have a family history of breast cancer should talk to their health care provider about genetic screening.  Women at a high risk of breast cancer should talk to their health care providers about having an MRI and a mammogram every year.   -Breast cancer gene (BRCA)-related cancer risk assessment is recommended for women who have family members with BRCA-related cancers. BRCA-related cancers include breast, ovarian, tubal, and peritoneal cancers.  Having family members with these cancers may be associated with an increased risk for harmful changes (mutations) in the breast cancer genes BRCA1 and BRCA2. Results of the assessment will determine the need for genetic counseling and BRCA1 and BRCA2 testing.   The Pap test is a screening test for cervical cancer. A Pap test can show cell changes on the cervix that might become cervical cancer if left untreated. A Pap test is a procedure in which cells are obtained and examined from the lower end of the uterus (cervix).   - Women should have a Pap test starting at age 78.   - Between ages 44 and 46, Pap tests should be repeated every 2 years.   - Beginning at age 9, you should have a Pap test every 3 years as long as the past 3 Pap tests have been normal.   - Some women have medical problems that increase  the chance of getting cervical cancer. Talk to your health care provider about these problems. It is especially important to talk to your health care provider if a new problem develops soon after your last Pap test. In these cases, your health care provider may recommend more frequent screening and Pap tests.   - The above recommendations are the same for women who have or have not gotten the vaccine for human papillomavirus (HPV).   - If you had a hysterectomy for a problem that was not cancer or a condition that could lead to cancer, then you no longer need Pap tests. Even if you no longer need a Pap test, a regular exam is a good idea to make sure no other problems are starting.   - If you are between ages 4 and 79 years, and you have had normal Pap tests going back 10 years, you no longer need Pap tests. Even if you no longer need a Pap test, a regular exam is a good idea to make sure no other problems are starting.   - If you have had past treatment for cervical cancer or a condition that could lead to cancer, you need Pap tests and screening for cancer for at least 20 years after your  treatment.   - If Pap tests have been discontinued, risk factors (such as a new sexual partner) need to be reassessed to determine if screening should be resumed.   - The HPV test is an additional test that may be used for cervical cancer screening. The HPV test looks for the virus that can cause the cell changes on the cervix. The cells collected during the Pap test can be tested for HPV. The HPV test could be used to screen women aged 30 years and older, and should be used in women of any age who have unclear Pap test results. After the age of 16, women should have HPV testing at the same frequency as a Pap test.   Colorectal cancer can be detected and often prevented. Most routine colorectal cancer screening begins at the age of 4 years and continues through age 37 years. However, your health care provider may recommend screening at an earlier age if you have risk factors for colon cancer. On a yearly basis, your health care provider may provide home test kits to check for hidden blood in the stool.  Use of a small camera at the end of a tube, to directly examine the colon (sigmoidoscopy or colonoscopy), can detect the earliest forms of colorectal cancer. Talk to your health care provider about this at age 1, when routine screening begins. Direct exam of the colon should be repeated every 5 -10 years through age 69 years, unless early forms of pre-cancerous polyps or small growths are found.   People who are at an increased risk for hepatitis B should be screened for this virus. You are considered at high risk for hepatitis B if:  -You were born in a country where hepatitis B occurs often. Talk with your health care provider about which countries are considered high risk.  - Your parents were born in a high-risk country and you have not received a shot to protect against hepatitis B (hepatitis B vaccine).  - You have HIV or AIDS.  - You use needles to inject street drugs.  - You live with, or  have sex with, someone who has Hepatitis B.  - You get hemodialysis treatment.  - You take certain medicines  for conditions like cancer, organ transplantation, and autoimmune conditions.   Hepatitis C blood testing is recommended for all people born from [birth year] through 1966 and any individual with known risks for hepatitis C.   Practice safe sex. Use condoms and avoid high-risk sexual practices to reduce the spread of sexually transmitted infections (STIs). STIs include gonorrhea, chlamydia, syphilis, trichomonas, herpes, HPV, and human immunodeficiency virus (HIV). Herpes, HIV, and HPV are viral illnesses that have no cure. They can result in disability, cancer, and death. Sexually active women aged 66 years and younger should be checked for chlamydia. Older women with new or multiple partners should also be tested for chlamydia. Testing for other STIs is recommended if you are sexually active and at increased risk.   Osteoporosis is a disease in which the bones lose minerals and strength with aging. This can result in serious bone fractures or breaks. The risk of osteoporosis can be identified using a bone density scan. Women ages 53 years and over and women at risk for fractures or osteoporosis should discuss screening with their health care providers. Ask your health care provider whether you should take a calcium supplement or vitamin D to There are also several preventive steps women can take to avoid osteoporosis and resulting fractures or to keep osteoporosis from worsening. -->Recommendations include:  Eat a balanced diet high in fruits, vegetables, calcium, and vitamins.  Get enough calcium. The recommended total intake of is 1,200 mg daily; for best absorption, if taking supplements, divide doses into 250-500 mg doses throughout the day. Of the two types of calcium, calcium carbonate is best absorbed when taken with food but calcium citrate can be taken on an empty stomach.  Get enough  vitamin D. [employer] and the [place] recommend at least 1,000 IU per day for women age 41 and over who are at risk of vitamin D deficiency. Vitamin D deficiency can be caused by inadequate sun exposure (for example, those who live in [place]).  Avoid alcohol and smoking. Heavy alcohol intake (more than 7 drinks per week) increases the risk of falls and hip fracture and women smokers tend to lose bone more rapidly and have lower bone mass than nonsmokers. Stopping smoking is one of the most important changes women can make to improve their health and decrease risk for disease.  Be physically active every day. Weight-bearing exercise (for example, fast walking, hiking, jogging, and weight training) may strengthen bones or slow the rate of bone loss that comes with aging. Balancing and muscle-strengthening exercises can reduce the risk of falling and fracture.  Consider therapeutic medications. Currently, several types of effective drugs are available. Healthcare providers can recommend the type most appropriate for each woman.  Eliminate environmental factors that may contribute to accidents. Falls cause nearly 90% of all osteoporotic fractures, so reducing this risk is an important bone-health strategy. Measures include ample lighting, removing obstructions to walking, using nonskid rugs on floors, and placing mats and/or grab bars in showers.  Be aware of medication side effects. Some common medicines make bones weaker. These include a type of steroid drug called glucocorticoids used for arthritis and asthma, some antiseizure drugs, certain sleeping pills, treatments for endometriosis, and some cancer drugs. An overactive thyroid gland or using too much thyroid hormone for an underactive thyroid can also be a problem. If you are taking these medicines, talk to your doctor about what you can do to help protect your bones.reduce the rate of osteoporosis.  Menopause can be  associated with physical symptoms and risks. Hormone replacement therapy is available to decrease symptoms and risks. You should talk to your health care provider about whether hormone replacement therapy is right for you.   Use sunscreen. Apply sunscreen liberally and repeatedly throughout the day. You should seek shade when your shadow is shorter than you. Protect yourself by wearing long sleeves, pants, a wide-brimmed hat, and sunglasses year round, whenever you are outdoors.   Once a month, do a whole body skin exam, using a mirror to look at the skin on your back. Tell your health care provider of new moles, moles that have irregular borders, moles that are larger than a pencil eraser, or moles that have changed in shape or color.   -Stay current with required vaccines (immunizations).   Influenza vaccine. All adults should be immunized every year.  Tetanus, diphtheria, and acellular pertussis (Td, Tdap) vaccine. Pregnant women should receive 1 dose of Tdap vaccine during each pregnancy. The dose should be obtained regardless of the length of time since the last dose. Immunization is preferred during the 27th 36th week of gestation. An adult who has not previously received Tdap or who does not know her vaccine status should receive 1 dose of Tdap. This initial dose should be followed by tetanus and diphtheria toxoids (Td) booster doses every 10 years. Adults with an unknown or incomplete history of completing a 3-dose immunization series with Td-containing vaccines should begin or complete a primary immunization series including a Tdap dose. Adults should receive a Td booster every 10 years.  Varicella vaccine. An adult without evidence of immunity to varicella should receive 2 doses or a second dose if she has previously received 1 dose. Pregnant females who do not have evidence of immunity should receive the first dose after pregnancy. This first dose should be obtained before leaving the  health care facility. The second dose should be obtained 4 8 weeks after the first dose.  Human papillomavirus (HPV) vaccine. Females aged 48 26 years who have not received the vaccine previously should obtain the 3-dose series. The vaccine is not recommended for use in pregnant females. However, pregnancy testing is not needed before receiving a dose. If a female is found to be pregnant after receiving a dose, no treatment is needed. In that case, the remaining doses should be delayed until after the pregnancy. Immunization is recommended for any person with an immunocompromised condition through the age of 24 years if she did not get any or all doses earlier. During the 3-dose series, the second dose should be obtained 4 8 weeks after the first dose. The third dose should be obtained 24 weeks after the first dose and 16 weeks after the second dose.  Zoster vaccine. One dose is recommended for adults aged 7 years or older unless certain conditions are present.  Measles, mumps, and rubella (MMR) vaccine. Adults born before 8 generally are considered immune to measles and mumps. Adults born in [birth year] or later should have 1 or more doses of MMR vaccine unless there is a contraindication to the vaccine or there is laboratory evidence of immunity to each of the three diseases. A routine second dose of MMR vaccine should be obtained at least 28 days after the first dose for students attending postsecondary schools, health care workers, or international travelers. People who received inactivated measles vaccine or an unknown type of measles vaccine during 1963 1967 should receive 2 doses of MMR vaccine. People who  received inactivated mumps vaccine or an unknown type of mumps vaccine before 1979 and are at high risk for mumps infection should consider immunization with 2 doses of MMR vaccine. For females of childbearing age, rubella immunity should be determined. If there is no evidence of immunity, females who  are not pregnant should be vaccinated. If there is no evidence of immunity, females who are pregnant should delay immunization until after pregnancy. Unvaccinated health care workers born before [birth year] who lack laboratory evidence of measles, mumps, or rubella immunity or laboratory confirmation of disease should consider measles and mumps immunization with 2 doses of MMR vaccine or rubella immunization with 1 dose of MMR vaccine.  Pneumococcal 13-valent conjugate (PCV13) vaccine. When indicated, a person who is uncertain of her immunization history and has no record of immunization should receive the PCV13 vaccine. An adult aged 85 years or older who has certain medical conditions and has not been previously immunized should receive 1 dose of PCV13 vaccine. This PCV13 should be followed with a dose of pneumococcal polysaccharide (PPSV23) vaccine. The PPSV23 vaccine dose should be obtained at least 8 weeks after the dose of PCV13 vaccine. An adult aged 32 years or older who has certain medical conditions and previously received 1 or more doses of PPSV23 vaccine should receive 1 dose of PCV13. The PCV13 vaccine dose should be obtained 1 or more years after the last PPSV23 vaccine dose.  Pneumococcal polysaccharide (PPSV23) vaccine. When PCV13 is also indicated, PCV13 should be obtained first. All adults aged 57 years and older should be immunized. An adult younger than age 50 years who has certain medical conditions should be immunized. Any person who resides in a nursing home or long-term care facility should be immunized. An adult smoker should be immunized. People with an immunocompromised condition and certain other conditions should receive both PCV13 and PPSV23 vaccines. People with human immunodeficiency virus (HIV) infection should be immunized as soon as possible after diagnosis. Immunization during chemotherapy or radiation therapy should be avoided. Routine use of PPSV23 vaccine is not recommended for  American Indians, [place] Natives, or people younger than 65 years unless there are medical conditions that require PPSV23 vaccine. When indicated, people who have unknown immunization and have no record of immunization should receive PPSV23 vaccine. One-time revaccination 5 years after the first dose of PPSV23 is recommended for people aged 4 64 years who have chronic kidney failure, nephrotic syndrome, asplenia, or immunocompromised conditions. People who received 1 2 doses of PPSV23 before age 70 years should receive another dose of PPSV23 vaccine at age 19 years or later if at least 5 years have passed since the previous dose. Doses of PPSV23 are not needed for people immunized with PPSV23 at or after age 4 years.  Meningococcal vaccine. Adults with asplenia or persistent complement component deficiencies should receive 2 doses of quadrivalent meningococcal conjugate (MenACWY-D) vaccine. The doses should be obtained at least 2 months apart. Microbiologists working with certain meningococcal bacteria, [place] recruits, people at risk during an outbreak, and people who travel to or live in countries with a high rate of meningitis should be immunized. A first-year college student up through age 49 years who is living in a residence hall should receive a dose if she did not receive a dose on or after her 16th birthday. Adults who have certain high-risk conditions should receive one or more doses of vaccine.  Hepatitis A vaccine. Adults who wish to be protected from this disease, have certain high-risk  conditions, work with hepatitis A-infected animals, work in hepatitis A research labs, or travel to or work in countries with a high rate of hepatitis A should be immunized. Adults who were previously unvaccinated and who anticipate close contact with an international adoptee during the first 60 days after arrival in the [place] from a country with a high rate of hepatitis A should be  immunized.  Hepatitis B vaccine.  Adults who wish to be protected from this disease, have certain high-risk conditions, may be exposed to blood or other infectious body fluids, are household contacts or sex partners of hepatitis B positive people, are clients or workers in certain care facilities, or travel to or work in countries with a high rate of hepatitis B should be immunized.  Haemophilus influenzae type b (Hib) vaccine. A previously unvaccinated person with asplenia or sickle cell disease or having a scheduled splenectomy should receive 1 dose of Hib vaccine. Regardless of previous immunization, a recipient of a hematopoietic stem cell transplant should receive a 3-dose series 6 12 months after her successful transplant. Hib vaccine is not recommended for adults with HIV infection.  Preventive Services / Frequency Ages 34 to 39years  Blood pressure check.** / Every 1 to 2 years.  Lipid and cholesterol check.** / Every 5 years beginning at age 63.  Clinical breast exam.** / Every 3 years for women in their 47s and 69s.  BRCA-related cancer risk assessment.** / For women who have family members with a BRCA-related cancer (breast, ovarian, tubal, or peritoneal cancers).  Pap test.** / Every 2 years from ages 101 through 45. Every 3 years starting at age 87 through age 73 or 9 with a history of 3 consecutive normal Pap tests.  HPV screening.** / Every 3 years from ages 21 through ages 35 to 61 with a history of 3 consecutive normal Pap tests.  Hepatitis C blood test.** / For any individual with known risks for hepatitis C.  Skin self-exam. / Monthly.  Influenza vaccine. / Every year.  Tetanus, diphtheria, and acellular pertussis (Tdap, Td) vaccine.** / Consult your health care provider. Pregnant women should receive 1 dose of Tdap vaccine during each pregnancy. 1 dose of Td every 10 years.  Varicella vaccine.** / Consult your health care provider. Pregnant females who do not have  evidence of immunity should receive the first dose after pregnancy.  HPV vaccine. / 3 doses over 6 months, if 20 and younger. The vaccine is not recommended for use in pregnant females. However, pregnancy testing is not needed before receiving a dose.  Measles, mumps, rubella (MMR) vaccine.** / You need at least 1 dose of MMR if you were born in 1957 or later. You may also need a 2nd dose. For females of childbearing age, rubella immunity should be determined. If there is no evidence of immunity, females who are not pregnant should be vaccinated. If there is no evidence of immunity, females who are pregnant should delay immunization until after pregnancy.  Pneumococcal 13-valent conjugate (PCV13) vaccine.** / Consult your health care provider.  Pneumococcal polysaccharide (PPSV23) vaccine.** / 1 to 2 doses if you smoke cigarettes or if you have certain conditions.  Meningococcal vaccine.** / 1 dose if you are age 57 to 66 years and a Market researcher living in a residence hall, or have one of several medical conditions, you need to get vaccinated against meningococcal disease. You may also need additional booster doses.  Hepatitis A vaccine.** / Consult your health care provider.  Hepatitis B vaccine.** / Consult your health care provider.  Haemophilus influenzae type b (Hib) vaccine.** / Consult your health care provider.  Ages 55 to 64years  Blood pressure check.** / Every 1 to 2 years.  Lipid and cholesterol check.** / Every 5 years beginning at age 33 years.  Lung cancer screening. / Every year if you are aged 58 80 years and have a 30-pack-year history of smoking and currently smoke or have quit within the past 15 years. Yearly screening is stopped once you have quit smoking for at least 15 years or develop a health problem that would prevent you from having lung cancer treatment.  Clinical breast exam.** / Every year after age 56 years.  BRCA-related cancer risk  assessment.** / For women who have family members with a BRCA-related cancer (breast, ovarian, tubal, or peritoneal cancers).  Mammogram.** / Every year beginning at age 58 years and continuing for as long as you are in good health. Consult with your health care provider.  Pap test.** / Every 3 years starting at age 89 years through age 37 or 3 years with a history of 3 consecutive normal Pap tests.  HPV screening.** / Every 3 years from ages 69 years through ages 14 to 18 years with a history of 3 consecutive normal Pap tests.  Fecal occult blood test (FOBT) of stool. / Every year beginning at age 63 years and continuing until age 72 years. You may not need to do this test if you get a colonoscopy every 10 years.  Flexible sigmoidoscopy or colonoscopy.** / Every 5 years for a flexible sigmoidoscopy or every 10 years for a colonoscopy beginning at age 24 years and continuing until age 74 years.  Hepatitis C blood test.** / For all people born from [birth year] through 1966 and any individual with known risks for hepatitis C.  Skin self-exam. / Monthly.  Influenza vaccine. / Every year.  Tetanus, diphtheria, and acellular pertussis (Tdap/Td) vaccine.** / Consult your health care provider. Pregnant women should receive 1 dose of Tdap vaccine during each pregnancy. 1 dose of Td every 10 years.  Varicella vaccine.** / Consult your health care provider. Pregnant females who do not have evidence of immunity should receive the first dose after pregnancy.  Zoster vaccine.** / 1 dose for adults aged 50 years or older.  Measles, mumps, rubella (MMR) vaccine.** / You need at least 1 dose of MMR if you were born in 1957 or later. You may also need a 2nd dose. For females of childbearing age, rubella immunity should be determined. If there is no evidence of immunity, females who are not pregnant should be vaccinated. If there is no evidence of immunity, females who are pregnant should delay immunization until  after pregnancy.  Pneumococcal 13-valent conjugate (PCV13) vaccine.** / Consult your health care provider.  Pneumococcal polysaccharide (PPSV23) vaccine.** / 1 to 2 doses if you smoke cigarettes or if you have certain conditions.  Meningococcal vaccine.** / Consult your health care provider.  Hepatitis A vaccine.** / Consult your health care provider.  Hepatitis B vaccine.** / Consult your health care provider.  Haemophilus influenzae type b (Hib) vaccine.** / Consult your health care provider.  Ages 4 years and over  Blood pressure check.** / Every 1 to 2 years.  Lipid and cholesterol check.** / Every 5 years beginning at age 44 years.  Lung cancer screening. / Every year if you are aged 37 80 years and have a 30-pack-year history of smoking and currently smoke  or have quit within the past 15 years. Yearly screening is stopped once you have quit smoking for at least 15 years or develop a health problem that would prevent you from having lung cancer treatment.  Clinical breast exam.** / Every year after age 52 years.  BRCA-related cancer risk assessment.** / For women who have family members with a BRCA-related cancer (breast, ovarian, tubal, or peritoneal cancers).  Mammogram.** / Every year beginning at age 36 years and continuing for as long as you are in good health. Consult with your health care provider.  Pap test.** / Every 3 years starting at age 58 years through age 49 or 39 years with 3 consecutive normal Pap tests. Testing can be stopped between 65 and 70 years with 3 consecutive normal Pap tests and no abnormal Pap or HPV tests in the past 10 years.  HPV screening.** / Every 3 years from ages 94 years through ages 55 or 16 years with a history of 3 consecutive normal Pap tests. Testing can be stopped between 65 and 70 years with 3 consecutive normal Pap tests and no abnormal Pap or HPV tests in the past 10 years.  Fecal occult blood test (FOBT) of stool. / Every year  beginning at age 73 years and continuing until age 12 years. You may not need to do this test if you get a colonoscopy every 10 years.  Flexible sigmoidoscopy or colonoscopy.** / Every 5 years for a flexible sigmoidoscopy or every 10 years for a colonoscopy beginning at age 51 years and continuing until age 4 years.  Hepatitis C blood test.** / For all people born from [birth year] through 1966 and any individual with known risks for hepatitis C.  Osteoporosis screening.** / A one-time screening for women ages 21 years and over and women at risk for fractures or osteoporosis.  Skin self-exam. / Monthly.  Influenza vaccine. / Every year.  Tetanus, diphtheria, and acellular pertussis (Tdap/Td) vaccine.** / 1 dose of Td every 10 years.  Varicella vaccine.** / Consult your health care provider.  Zoster vaccine.** / 1 dose for adults aged 62 years or older.  Pneumococcal 13-valent conjugate (PCV13) vaccine.** / Consult your health care provider.  Pneumococcal polysaccharide (PPSV23) vaccine.** / 1 dose for all adults aged 72 years and older.  Meningococcal vaccine.** / Consult your health care provider.  Hepatitis A vaccine.** / Consult your health care provider.  Hepatitis B vaccine.** / Consult your health care provider.  Haemophilus influenzae type b (Hib) vaccine.** / Consult your health care provider. ** Family history and personal history of risk and conditions may change your health care provider's recommendations. Document Released: 07/28/2001 Document Revised: 03/22/2013  [facility] Patient Information 2014 [place], [place].   EXERCISE AND DIET:  We recommended that you start or continue a regular exercise program for good health. Regular exercise means any activity that makes your heart beat faster and makes you sweat.  We recommend exercising at least 30 minutes per day at least 3 days a week, preferably 5.  We also recommend a diet low in fat and sugar / carbohydrates.  Inactivity,  poor dietary choices and obesity can cause diabetes, heart attack, stroke, and kidney damage, among others.     ALCOHOL AND SMOKING:  Women should limit their alcohol intake to no more than 7 drinks/beers/glasses of wine (combined, not each!) per week. Moderation of alcohol intake to this level decreases your risk of breast cancer and liver damage.  ( And of course, no recreational drugs  are part of a healthy lifestyle.)  Also, you should not be smoking at all or even being exposed to second hand smoke. Most people know smoking can cause cancer, and various heart and lung diseases, but did you know it also contributes to weakening of your bones?  Aging of your skin?  Yellowing of your teeth and nails?   CALCIUM AND VITAMIN D:  Adequate intake of calcium and Vitamin D are recommended.  The recommendations for exact amounts of these supplements seem to change often, but generally speaking 600 mg of calcium (either carbonate or citrate) and 800 units of Vitamin D per day seems prudent. Certain women may benefit from higher intake of Vitamin D.  If you are among these women, your doctor will have told you during your visit.     PAP SMEARS:  Pap smears, to check for cervical cancer or precancers,  have traditionally been done yearly, although recent scientific advances have shown that most women can have pap smears less often.  However, every woman still should have a physical exam from her gynecologist or primary care physician every year. It will include a breast check, inspection of the vulva and vagina to check for abnormal growths or skin changes, a visual exam of the cervix, and then an exam to evaluate the size and shape of the uterus and ovaries.  And after 68 years of age, a rectal exam is indicated to check for rectal cancers. We will also provide age appropriate advice regarding health maintenance, like when you should have certain vaccines, screening for sexually transmitted diseases, bone density  testing, colonoscopy, mammograms, etc.    MAMMOGRAMS:  All women over 52 years old should have a yearly mammogram. Many facilities now offer a "3D" mammogram, which may cost around $50 extra out of pocket. If possible,  we recommend you accept the option to have the 3D mammogram performed.  It both reduces the number of women who will be called back for extra views which then turn out to be normal, and it is better than the routine mammogram at detecting truly abnormal areas.     COLONOSCOPY:  Colonoscopy to screen for colon cancer is recommended for all women at age 60.  We know, you hate the idea of the prep.  We agree, BUT, having colon cancer and not knowing it is worse!!  Colon cancer so often starts as a polyp that can be seen and removed at colonscopy, which can quite literally save your life!  And if your first colonoscopy is normal and you have no family history of colon cancer, most women don't have to have it again for 10 years.  Once every ten years, you can do something that may end up saving your life, right?  We will be happy to help you get it scheduled when you are ready.  Be sure to check your insurance coverage so you understand how much it will cost.  It may be covered as a preventative service at no cost, but you should check your particular policy.

## 2018-03-28 NOTE — Addendum Note (Signed)
Addended by: Lanier Prude D on: 03/28/2018 01:13 PM   Modules accepted: Orders

## 2018-03-29 LAB — COMPREHENSIVE METABOLIC PANEL
ALT: 20 IU/L (ref 0–32)
AST: 18 IU/L (ref 0–40)
Albumin/Globulin Ratio: 1.7 (ref 1.2–2.2)
Albumin: 4.3 g/dL (ref 3.6–4.8)
Alkaline Phosphatase: 88 IU/L (ref 39–117)
BUN/Creatinine Ratio: 14 (ref 12–28)
BUN: 10 mg/dL (ref 8–27)
Bilirubin Total: 0.6 mg/dL (ref 0.0–1.2)
CO2: 26 mmol/L (ref 20–29)
CREATININE: 0.74 mg/dL (ref 0.57–1.00)
Calcium: 9.6 mg/dL (ref 8.7–10.3)
Chloride: 102 mmol/L (ref 96–106)
GFR, EST AFRICAN AMERICAN: 96 mL/min/{1.73_m2} (ref >59)
GFR, EST NON AFRICAN AMERICAN: 84 mL/min/{1.73_m2} (ref >59)
GLOBULIN, TOTAL: 2.5 g/dL (ref 1.5–4.5)
Glucose: 94 mg/dL (ref 65–99)
POTASSIUM: 4.2 mmol/L (ref 3.5–5.2)
Sodium: 143 mmol/L (ref 134–144)
TOTAL PROTEIN: 6.8 g/dL (ref 6.0–8.5)

## 2018-03-29 LAB — HEMOGLOBIN A1C
ESTIMATED AVERAGE GLUCOSE: 111 mg/dL
Hgb A1c MFr Bld: 5.5 % (ref 4.8–5.6)

## 2018-03-29 LAB — CBC WITH DIFFERENTIAL/PLATELET
BASOS: 1 %
Basophils Absolute: 0.1 10*3/uL (ref 0.0–0.2)
EOS (ABSOLUTE): 0.2 10*3/uL (ref 0.0–0.4)
EOS: 3 %
HEMATOCRIT: 43.6 % (ref 34.0–46.6)
HEMOGLOBIN: 14.6 g/dL (ref 11.1–15.9)
IMMATURE GRANS (ABS): 0 10*3/uL (ref 0.0–0.1)
IMMATURE GRANULOCYTES: 0 %
LYMPHS: 21 %
Lymphocytes Absolute: 1.1 10*3/uL (ref 0.7–3.1)
MCH: 29.9 pg (ref 26.6–33.0)
MCHC: 33.5 g/dL (ref 31.5–35.7)
MCV: 89 fL (ref 79–97)
Monocytes Absolute: 0.4 10*3/uL (ref 0.1–0.9)
Monocytes: 7 %
NEUTROS ABS: 3.6 10*3/uL (ref 1.4–7.0)
NEUTROS PCT: 68 %
Platelets: 274 10*3/uL (ref 150–450)
RBC: 4.88 x10E6/uL (ref 3.77–5.28)
RDW: 12.9 % (ref 12.3–15.4)
WBC: 5.4 10*3/uL (ref 3.4–10.8)

## 2018-03-29 LAB — LIPID PANEL
CHOLESTEROL TOTAL: 196 mg/dL (ref 100–199)
Chol/HDL Ratio: 2.9 ratio (ref 0.0–4.4)
HDL: 67 mg/dL (ref >39)
LDL Calculated: 105 mg/dL — ABNORMAL HIGH (ref 0–99)
Triglycerides: 120 mg/dL (ref 0–149)
VLDL CHOLESTEROL CAL: 24 mg/dL (ref 5–40)

## 2018-03-29 LAB — TSH: TSH: 2.59 u[IU]/mL (ref 0.450–4.500)

## 2018-03-29 LAB — T4, FREE: Free T4: 1.14 ng/dL (ref 0.82–1.77)

## 2018-03-29 LAB — VITAMIN D 25 HYDROXY (VIT D DEFICIENCY, FRACTURES): Vit D, 25-Hydroxy: 52.8 ng/mL (ref 30.0–100.0)

## 2018-04-14 ENCOUNTER — Ambulatory Visit (INDEPENDENT_AMBULATORY_CARE_PROVIDER_SITE_OTHER): Payer: Medicare Other | Admitting: Family Medicine

## 2018-04-14 ENCOUNTER — Telehealth: Payer: Self-pay | Admitting: Family Medicine

## 2018-04-14 ENCOUNTER — Encounter: Payer: Self-pay | Admitting: Family Medicine

## 2018-04-14 VITALS — BP 150/85 | HR 79 | Ht 67.0 in | Wt 125.0 lb

## 2018-04-14 DIAGNOSIS — I10 Essential (primary) hypertension: Secondary | ICD-10-CM

## 2018-04-14 DIAGNOSIS — E785 Hyperlipidemia, unspecified: Secondary | ICD-10-CM

## 2018-04-14 DIAGNOSIS — Z5320 Procedure and treatment not carried out because of patient's decision for unspecified reasons: Secondary | ICD-10-CM

## 2018-04-14 DIAGNOSIS — Z8249 Family history of ischemic heart disease and other diseases of the circulatory system: Secondary | ICD-10-CM | POA: Diagnosis not present

## 2018-04-14 DIAGNOSIS — Z9119 Patient's noncompliance with other medical treatment and regimen: Secondary | ICD-10-CM

## 2018-04-14 DIAGNOSIS — Z91199 Patient's noncompliance with other medical treatment and regimen due to unspecified reason: Secondary | ICD-10-CM

## 2018-04-14 NOTE — Progress Notes (Signed)
Impression and Recommendations:    1. HTN (hypertension) with goal to be determined   2. Refuses treatment- BP and Chol meds   3. Family history of hypertension   4. Hyperlipidemia, unspecified hyperlipidemia type   5. Noncompliance     1. Hypertension - Blood pressure remains elevated at this time.  - Recommended change in treatment plan today.  -Patient did not follow our recommendations of his home blood pressure monitoring and bringing in a log since last seen about a month ago.  She has not checked it once at home for the past month since last seen and told to do so.  - Educated patient at length today about importance of beginning treatment.  Patient declines blood pressure medication today.  Patient understands the risks of not beginning treatment, and understands potential detriment to her health.  Extensively reviewed the fact that high blood pressure may cause damage to the heart, kidneys, and other organs, as well as increase risk for CAD, stroke, and other complications.  Educated patient extensively.  - Reviewed goal blood pressure as less than 130/80.  - Reviewed the only way to assess her blood pressure is by checking measurements at home.  Ambulatory BP monitoring STRONGLY encouraged.  Discussed prudent habits, sitting for 15 minutes quietly just before measuring BP, with no stimulation prior.  - Please check BP once daily and keep a log.  Bring this log in to next OV.  - Lifestyle changes such as dash diet and engaging in a regular exercise program discussed with patient.  Educational handouts provided.  - Extensive education about prudent care of blood pressure provided to patient during appointment today. - STRONGLY encouraged patient to Google the AHA to study hypertension and educate herself.   2. HLD -It was brought to our attention after today's office visit that the patient was also here to review lab work.  This was not known to me before or during  the office visit.  Thus, after review of her recent labs everything is good except for her cholesterol.  She meets criteria to be on a moderate high dose intensity statin and as such I will have my CMA call her to notify her.  However, it is likely she will not go on it since she would not start a blood pressure medicine today either. - The 10-year ASCVD risk score Ashley Gutierrez., et al., 2013) is: 9.7%   Values used to calculate the score:     Age: 3 years     Sex: Female     Is Non-Hispanic African American: No     Diabetic: No     Tobacco smoker: No     Systolic Blood Pressure: 202 mmHg     Is BP treated: No     HDL Cholesterol: 67 mg/dL     Total Cholesterol: 196 mg/dL    2. Lifestyle & Preventative Health Maintenance - Advised patient to continue working toward exercising to improve overall mental, physical, and emotional health.    American Heart Association guidelines for healthy diet, basically Mediterranean diet, and exercise guidelines of 30 minutes 5 days per week or more discussed in detail.  Health counseling performed.  All questions answered.  - Encouraged patient to engage in daily physical activity, especially a formal exercise routine.  Recommended that the patient eventually strive for at least 150 minutes of moderate cardiovascular activity per week according to guidelines established by the [facility].   - Healthy dietary  habits encouraged, including low-carb, and high amounts of lean protein in diet.   - Patient should also consume adequate amounts of water - half of body weight in oz of water per day.  3. Follow-Up - Prescriptions refilled today. - Re-check fasting lab work as recommended. - Otherwise, continue to return for CPE and chronic follow-up as scheduled.   - Patient knows to call in sooner if desired to address acute concerns.   Gross side effects, risk and benefits, and alternatives of medications and treatment plan in general discussed with patient.   Patient is aware that all medications have potential side effects and we are unable to predict every side effect or drug-drug interaction that may occur.   Patient will call with any questions prior to using medication if they have concerns.    Expresses verbal understanding and consents to current therapy and treatment regimen.  No barriers to understanding were identified.  Red flag symptoms and signs discussed in detail.  Patient expressed understanding regarding what to do in case of emergency\urgent symptoms  Please see AVS handed out to patient at the end of our visit for further patient instructions/ counseling done pertaining to today's office visit.   Return for (1) 78mo- HTN.     Note:  This note was prepared with assistance of Dragon voice recognition software. Occasional wrong-word or sound-a-like substitutions may have occurred due to the inherent limitations of voice recognition software.   This document serves as a record of services personally performed by Mellody Dance, DO. It was created on her behalf by Toni Amend, a trained medical scribe. The creation of this record is based on the scribe's personal observations and the provider's statements to them.   I have reviewed the above medical documentation for accuracy and completeness and I concur.  Mellody Dance 04/14/18 12:57 PM     ------------------------------------------------------------------------------------------------------------------------------------    Subjective:     HPI: Ashley Gutierrez is a 67 y.o. female who presents [facility] today for issues as discussed below.  Denies unusual stress at home.  States that she exercises at home.  "Yesterday I walked in the rain."  Feels that she walks about a half an hour 4-5 days per week.  She eats cereal for breakfast; granola with walnuts and raisins and blueberries.   1. HTN HPI:  -  Her blood pressure may or may  not be controlled at home.  Pt is not checking it at home.   Patient last checked her BP twice on September 5th, 2019: 126/76, and 125/77.  Notes she stopped taking her blood pressure at home.  "I may have taken it and not written it down."  - Patient reports good compliance with blood pressure medications  - Denies medication S-E   - Smoking Status noted   - She denies new onset of: chest pain, exercise intolerance, shortness of breath, dizziness, visual changes, headache, lower extremity swelling or claudication.    Last 3 blood pressure readings in our office are as follows: BP Readings from Last 3 Encounters:  04/14/18 (!) 150/85  03/28/18 (!) 155/92  05/31/17 138/76    Filed Weights   04/14/18 1038  Weight: 125 lb (56.7 kg)     Wt Readings from Last 3 Encounters:  04/14/18 125 lb (56.7 kg)  03/28/18 125 lb 6.4 oz (56.9 kg)  04/29/16 129 lb (58.5 kg)   BP Readings from Last 3 Encounters:  04/14/18 (!) 150/85  03/28/18 Marland Kitchen)  155/92  05/31/17 138/76   Pulse Readings from Last 3 Encounters:  04/14/18 79  03/28/18 79  05/31/17 82   BMI Readings from Last 3 Encounters:  04/14/18 19.58 kg/m  03/28/18 19.64 kg/m  04/29/16 20.20 kg/m     Patient Care Team    Relationship Specialty Notifications Start End  Mellody Dance, DO PCP - General Family Medicine  04/29/16   Wonda Horner, MD Consulting Physician Gastroenterology  04/29/16   Tanda Rockers, MD Consulting Physician Pulmonary Disease  04/29/16      Patient Active Problem List   Diagnosis Date Noted  . Refuses treatment- BP and Chol meds 04/14/2018    Priority: High  . HTN (hypertension) with goal to be determined 04/14/2018  . Hyperlipidemia 04/14/2018  . Noncompliance 04/14/2018  . 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 Vitamin D deficiency 05/19/2016  . Family history of hypertension 04/29/2016  . Family history of hyperlipidemia 04/29/2016  . Family history of colonic polyps 04/29/2016    Past  Medical history, Surgical history, Family history, Social history, Allergies and Medications have been entered into the medical record, reviewed and changed as needed.    Current Meds  Medication Sig  . Cholecalciferol (VITAMIN D PO) Take 1 capsule by mouth.  . Multiple Vitamin (MULTIVITAMIN) capsule Take 1 capsule by mouth daily.    Allergies:  No Known Allergies   Review of Systems:  A fourteen system review of systems was performed and found to be positive as per HPI.   Objective:   Blood pressure (!) 150/85, pulse 79, height 5\' 7"  (1.702 m), weight 125 lb (56.7 kg), SpO2 100 %. Body mass index is 19.58 kg/m. General:  Well Developed, well nourished, appropriate for stated age.  Neuro:  Alert and oriented,  extra-ocular muscles intact  HEENT:  Normocephalic, atraumatic, neck supple, no carotid bruits appreciated  Skin:  no gross rash, warm, pink. Cardiac:  RRR, S1 S2 Respiratory:  ECTA B/L and A/P, Not using accessory muscles, speaking in full sentences- unlabored. Vascular:  Ext warm, no cyanosis apprec.; cap RF less 2 sec. Psych:  No HI/SI, judgement and insight good, Euthymic mood. Full Affect.

## 2018-04-14 NOTE — Telephone Encounter (Signed)
Noted that the patient will come in tomorrow. MPulliam, CMA/RT(R)

## 2018-04-14 NOTE — Patient Instructions (Addendum)
Please check your blood pressure Monday morning, Wednesday afternoon, and Friday night, as well as daily on the other days per week at random times.  Please follow up sooner than planned if your blood pressure remains above goal.    How to Take Your Blood Pressure Blood pressure is a measurement of how strongly your blood is pressing against the walls of your arteries. Arteries are blood vessels that carry blood from your heart throughout your body. Your health care provider takes your blood pressure at each office visit. You can also take your own blood pressure at home with a blood pressure machine. You may need to take your own blood pressure:  To confirm a diagnosis of high blood pressure (hypertension).  To monitor your blood pressure over time.  To make sure your blood pressure medicine is working.  Supplies needed: To take your blood pressure, you will need a blood pressure machine. You can buy a blood pressure machine, or blood pressure monitor, at most drugstores or online. There are several types of home blood pressure monitors. When choosing one, consider the following:  Choose a monitor that has an arm cuff.  Choose a monitor that wraps snugly around your upper arm. You should be able to fit only one finger between your arm and the cuff.  Do not choose a monitor that measures your blood pressure from your wrist or finger.  Your health care provider can suggest a reliable monitor that will meet your needs. How to prepare To get the most accurate reading, avoid the following for 30 minutes before you check your blood pressure:  Drinking caffeine.  Drinking alcohol.  Eating.  Smoking.  Exercising.  Five minutes before you check your blood pressure:  Empty your bladder.  Sit quietly without talking in a dining chair, rather than in a soft couch or armchair.  How to take your blood pressure To check your blood pressure, follow the instructions in the manual that came  with your blood pressure monitor. If you have a digital blood pressure monitor, the instructions may be as follows: 1. Sit up straight. 2. Place your feet on the floor. Do not cross your ankles or legs. 3. Rest your left arm at the level of your heart on a table or desk or on the arm of a chair. 4. Pull up your shirt sleeve. 5. Wrap the blood pressure cuff around the upper part of your left arm, 1 inch (2.5 cm) above your elbow. It is best to wrap the cuff around bare skin. 6. Fit the cuff snugly around your arm. You should be able to place only one finger between the cuff and your arm. 7. Position the cord inside the groove of your elbow. 8. Press the power button. 9. Sit quietly while the cuff inflates and deflates. 10. Read the digital reading on the monitor screen and write it down (record it). 11. Wait 2-3 minutes, then repeat the steps, starting at step 1.  What does my blood pressure reading mean? A blood pressure reading consists of a higher number over a lower number. Ideally, your blood pressure should be below 120/80. The first ("top") number is called the systolic pressure. It is a measure of the pressure in your arteries as your heart beats. The second ("bottom") number is called the diastolic pressure. It is a measure of the pressure in your arteries as the heart relaxes. Blood pressure is classified into four stages. The following are the stages for adults who  do not have a short-term serious illness or a chronic condition. Systolic pressure and diastolic pressure are measured in a unit called mm Hg. Normal  Systolic pressure: below 935.  Diastolic pressure: below 80. Elevated  Systolic pressure: 701-779.  Diastolic pressure: below 80. Hypertension stage 1  Systolic pressure: 390-300.  Diastolic pressure: 92-33. Hypertension stage 2  Systolic pressure: 007 or above.  Diastolic pressure: 90 or above. You can have prehypertension or hypertension even if only the  systolic or only the diastolic number in your reading is higher than normal. Follow these instructions at home:  Check your blood pressure as often as recommended by your health care provider.  Take your monitor to the next appointment with your health care provider to make sure: ? That you are using it correctly. ? That it provides accurate readings.  Be sure you understand what your goal blood pressure numbers are.  Tell your health care provider if you are having any side effects from blood pressure medicine. Contact a health care provider if:  Your blood pressure is consistently high. Get help right away if:  Your systolic blood pressure is higher than 180.  Your diastolic blood pressure is higher than 110. This information is not intended to replace advice given to you by your health care provider. Make sure you discuss any questions you have with your health care provider. Document Released: 11/08/2015 Document Revised: 01/21/2016 Document Reviewed: 11/08/2015 Elsevier Interactive Patient Education  2018 [employer].     Managing Your Hypertension Hypertension is commonly called high blood pressure. This is when the force of your blood pressing against the walls of your arteries is too strong. Arteries are blood vessels that carry blood from your heart throughout your body. Hypertension forces the heart to work harder to pump blood, and may cause the arteries to become narrow or stiff. Having untreated or uncontrolled hypertension can cause heart attack, stroke, kidney disease, and other problems. What are blood pressure readings? A blood pressure reading consists of a higher number over a lower number. Ideally, your blood pressure should be below 120/80. The first ("top") number is called the systolic pressure. It is a measure of the pressure in your arteries as your heart beats. The second ("bottom") number is called the diastolic pressure. It is a measure of the pressure in  your arteries as the heart relaxes. What does my blood pressure reading mean? Blood pressure is classified into four stages. Based on your blood pressure reading, your health care provider may use the following stages to determine what type of treatment you need, if any. Systolic pressure and diastolic pressure are measured in a unit called mm Hg. Normal  Systolic pressure: below 622.  Diastolic pressure: below 80. Elevated  Systolic pressure: 633-354.  Diastolic pressure: below 80. Hypertension stage 1  Systolic pressure: 562-563.  Diastolic pressure: 89-37. Hypertension stage 2  Systolic pressure: 342 or above.  Diastolic pressure: 90 or above. What health risks are associated with hypertension? Managing your hypertension is an important responsibility. Uncontrolled hypertension can lead to:  A heart attack.  A stroke.  A weakened blood vessel (aneurysm).  Heart failure.  Kidney damage.  Eye damage.  Metabolic syndrome.  Memory and concentration problems.  What changes can I make to manage my hypertension? Hypertension can be managed by making lifestyle changes and possibly by taking medicines. Your health care provider will help you make a plan to bring your blood pressure within a normal range. Eating and drinking  Eat a diet that is high in fiber and potassium, and low in salt (sodium), added sugar, and fat. An example eating plan is called the DASH (Dietary Approaches to Stop Hypertension) diet. To eat this way: ? Eat plenty of fresh fruits and vegetables. Try to fill half of your plate at each meal with fruits and vegetables. ? Eat whole grains, such as whole wheat pasta, brown rice, or whole grain bread. Fill about one quarter of your plate with whole grains. ? Eat low-fat diary products. ? Avoid fatty cuts of meat, processed or cured meats, and poultry with skin. Fill about one quarter of your plate with lean proteins such as fish, chicken without skin,  beans, eggs, and tofu. ? Avoid premade and processed foods. These tend to be higher in sodium, added sugar, and fat.  Reduce your daily sodium intake. Most people with hypertension should eat less than 1,500 mg of sodium a day.  Limit alcohol intake to no more than 1 drink a day for nonpregnant women and 2 drinks a day for men. One drink equals 12 oz of beer, 5 oz of wine, or 1 oz of hard liquor. Lifestyle  Work with your health care provider to maintain a healthy body weight, or to lose weight. Ask what an ideal weight is for you.  Get at least 30 minutes of exercise that causes your heart to beat faster (aerobic exercise) most days of the week. Activities may include walking, swimming, or biking.  Include exercise to strengthen your muscles (resistance exercise), such as weight lifting, as part of your weekly exercise routine. Try to do these types of exercises for 30 minutes at least 3 days a week.  Do not use any products that contain nicotine or tobacco, such as cigarettes and e-cigarettes. If you need help quitting, ask your health care provider.  Control any long-term (chronic) conditions you have, such as high cholesterol or diabetes. Monitoring  Monitor your blood pressure at home as told by your health care provider. Your personal target blood pressure may vary depending on your medical conditions, your age, and other factors.  Have your blood pressure checked regularly, as often as told by your health care provider. Working with your health care provider  Review all the medicines you take with your health care provider because there may be side effects or interactions.  Talk with your health care provider about your diet, exercise habits, and other lifestyle factors that may be contributing to hypertension.  Visit your health care provider regularly. Your health care provider can help you create and adjust your plan for managing hypertension. Will I need medicine to control  my blood pressure? Your health care provider may prescribe medicine if lifestyle changes are not enough to get your blood pressure under control, and if:  Your systolic blood pressure is 130 or higher.  Your diastolic blood pressure is 80 or higher.  Take medicines only as told by your health care provider. Follow the directions carefully. Blood pressure medicines must be taken as prescribed. The medicine does not work as well when you skip doses. Skipping doses also puts you at risk for problems. Contact a health care provider if:  You think you are having a reaction to medicines you have taken.  You have repeated (recurrent) headaches.  You feel dizzy.  You have swelling in your ankles.  You have trouble with your vision. Get help right away if:  You develop a severe headache or confusion.  You  have unusual weakness or numbness, or you feel faint.  You have severe pain in your chest or abdomen.  You vomit repeatedly.  You have trouble breathing. Summary  Hypertension is when the force of blood pumping through your arteries is too strong. If this condition is not controlled, it may put you at risk for serious complications.  Your personal target blood pressure may vary depending on your medical conditions, your age, and other factors. For most people, a normal blood pressure is less than 120/80.  Hypertension is managed by lifestyle changes, medicines, or both. Lifestyle changes include weight loss, eating a healthy, low-sodium diet, exercising more, and limiting alcohol. This information is not intended to replace advice given to you by your health care provider. Make sure you discuss any questions you have with your health care provider. Document Released: 02/24/2012 Document Revised: 04/29/2016 Document Reviewed: 04/29/2016 Elsevier Interactive Patient Education  2018 [place] out the DASH diet = 1.5 Gram Low Sodium Diet   A 1.5 gram sodium diet  restricts the amount of sodium in the diet to no more than 1.5 g or 1500 mg daily.  The American Heart Association recommends Americans over the age of 47 to consume no more than 1500 mg of sodium each day to reduce the risk of developing high blood pressure.  Research also shows that limiting sodium may reduce heart attack and stroke risk.  Many foods contain sodium for flavor and sometimes as a preservative.  When the amount of sodium in a diet needs to be low, it is important to know what to look for when choosing foods and drinks.  The following includes some information and guidelines to help make it easier for you to adapt to a low sodium diet.    QUICK TIPS  Do not add salt to food.  Avoid convenience items and fast food.  Choose unsalted snack foods.  Buy lower sodium products, often labeled as "lower sodium" or "no salt added."  Check food labels to learn how much sodium is in 1 serving.  When eating at a restaurant, ask that your food be prepared with less salt or none, if possible.    READING FOOD LABELS FOR SODIUM INFORMATION  The nutrition facts label is a good place to find how much sodium is in foods. Look for products with no more than 400 mg of sodium per serving.  Remember that 1.5 g = 1500 mg.  The food label may also list foods as:  Sodium-free: Less than 5 mg in a serving.  Very low sodium: 35 mg or less in a serving.  Low-sodium: 140 mg or less in a serving.  Light in sodium: 50% less sodium in a serving. For example, if a food that usually has 300 mg of sodium is changed to become light in sodium, it will have 150 mg of sodium.  Reduced sodium: 25% less sodium in a serving. For example, if a food that usually has 400 mg of sodium is changed to reduced sodium, it will have 300 mg of sodium.    CHOOSING FOODS  Grains  Avoid: Salted crackers and snack items. Some cereals, including instant hot cereals. Bread stuffing and biscuit mixes. Seasoned rice or pasta mixes.   Choose: Unsalted snack items. Low-sodium cereals, oats, puffed wheat and rice, shredded wheat. English muffins and bread. Pasta.  Meats  Avoid: Salted, canned, smoked, spiced, pickled meats, including fish and poultry. Bacon, ham, sausage, cold  cuts, hot dogs, anchovies.  Choose: Low-sodium canned tuna and salmon. Fresh or frozen meat, poultry, and fish.  Dairy  Avoid: Processed cheese and spreads. Cottage cheese. Buttermilk and condensed milk. Regular cheese.  Choose: Milk. Low-sodium cottage cheese. Yogurt. Sour cream. Low-sodium cheese.  Fruits and Vegetables  Avoid: Regular canned vegetables. Regular canned tomato sauce and paste. Frozen vegetables in sauces. Olives. Ashley Gutierrez. Relishes. Sauerkraut.  Choose: Low-sodium canned vegetables. Low-sodium tomato sauce and paste. Frozen or fresh vegetables. Fresh and frozen fruit.  Condiments  Avoid: Canned and packaged gravies. Worcestershire sauce. Tartar sauce. Barbecue sauce. Soy sauce. Steak sauce. Ketchup. Onion, garlic, and table salt. Meat flavorings and tenderizers.  Choose: Fresh and dried herbs and spices. Low-sodium varieties of mustard and ketchup. Lemon juice. Tabasco sauce. Horseradish.    SAMPLE 1.5 GRAM SODIUM MEAL PLAN:   Breakfast / Sodium (mg)  1 cup low-fat milk / 143 mg  1 whole-wheat English muffin / 240 mg  1 tbs heart-healthy margarine / 153 mg  1 hard-boiled egg / 139 mg  1 small orange / 0 mg  Lunch / Sodium (mg)  1 cup raw carrots / 76 mg  2 tbs no salt added peanut butter / 5 mg  2 slices whole-wheat bread / 270 mg  1 tbs jelly / 6 mg   cup red grapes / 2 mg  Dinner / Sodium (mg)  1 cup whole-wheat pasta / 2 mg  1 cup low-sodium tomato sauce / 73 mg  3 oz lean ground beef / 57 mg  1 small side salad (1 cup raw spinach leaves,  cup cucumber,  cup yellow bell pepper) with 1 tsp olive oil and 1 tsp red wine vinegar / 25 mg  Snack / Sodium (mg)  1 container low-fat vanilla yogurt / 107 mg  3 graham cracker  squares / 127 mg  Nutrient Analysis  Calories: 1745  Protein: 75 g  Carbohydrate: 237 g  Fat: 57 g  Sodium: 1425 mg  Document Released: 06/01/2005 Document Revised: 02/11/2011 Document Reviewed: 09/02/2009  [facility] Patient Information 2012 [place], [place].

## 2018-04-14 NOTE — Telephone Encounter (Signed)
Patient was seen in office for appointment today. MPulliam, CMA/RT(R)

## 2018-04-14 NOTE — Telephone Encounter (Signed)
Pt in for OV 11/31 & notices BP diff when taken at home on her cuff than reading in DO(wants to bring hers by for comparison testing to see if White coat syndrome)  --Pt to come on Friday--Nov 1st between( 1-2pm ).  --glh

## 2018-06-14 ENCOUNTER — Ambulatory Visit
Admission: RE | Admit: 2018-06-14 | Discharge: 2018-06-14 | Disposition: A | Discharge status: Home / Self Care | Payer: Medicare Other | Source: Ambulatory Visit | Attending: Family Medicine | Admitting: Family Medicine

## 2018-06-14 DIAGNOSIS — Z1239 Encounter for other screening for malignant neoplasm of breast: Secondary | ICD-10-CM

## 2018-06-14 DIAGNOSIS — E2839 Other primary ovarian failure: Secondary | ICD-10-CM

## 2018-07-11 DIAGNOSIS — Z1283 Encounter for screening for malignant neoplasm of skin: Secondary | ICD-10-CM | POA: Diagnosis not present

## 2018-07-11 DIAGNOSIS — L720 Epidermal cyst: Secondary | ICD-10-CM | POA: Diagnosis not present

## 2018-07-11 DIAGNOSIS — D225 Melanocytic nevi of trunk: Secondary | ICD-10-CM | POA: Diagnosis not present

## 2018-09-12 ENCOUNTER — Other Ambulatory Visit: Payer: Self-pay

## 2018-09-12 ENCOUNTER — Encounter: Payer: Self-pay | Admitting: Family Medicine

## 2018-09-12 ENCOUNTER — Ambulatory Visit (INDEPENDENT_AMBULATORY_CARE_PROVIDER_SITE_OTHER): Payer: Medicare PPO | Admitting: Family Medicine

## 2018-09-12 VITALS — BP 119/79

## 2018-09-12 DIAGNOSIS — Z719 Counseling, unspecified: Secondary | ICD-10-CM

## 2018-09-12 DIAGNOSIS — R030 Elevated blood-pressure reading, without diagnosis of hypertension: Secondary | ICD-10-CM

## 2018-09-12 DIAGNOSIS — Z7189 Other specified counseling: Secondary | ICD-10-CM | POA: Diagnosis not present

## 2018-09-12 DIAGNOSIS — E785 Hyperlipidemia, unspecified: Secondary | ICD-10-CM | POA: Diagnosis not present

## 2018-09-12 NOTE — Progress Notes (Signed)
Virtual Visit via Telephone Note for [employer], D.O- Primary Care Physician at [facility]   I connected with current patient today by telephone and verified that I am speaking with the correct person using two identifiers.   I discussed the limitations, risks, security and privacy concerns of performing an evaluation and management service by telephone and the limited availability of in person appointments during this current national crisis of the Covid-19 pandemic.  My staff members also discussed with the patient that there may be a patient responsible charge related to this service.  The patient expressed understanding and agreed to proceed.     History of Present Illness:    Retired- not working- keeping distance from everyone as much possible   HTN:   last ov 04/14/18-  bp at that time was 150/85;  At home 154/88 after that OV.    Hasn't been checking BP at home since Dec.   BP the last 2 wks:  123/88, 126/ 81,  129/79,  121/75,  113/ 68,  119/79,  HR in the 70's.   went to eye doc recently - no evidence of HTN damage to eyes.    HLD:  03/28/18:   ASCVD risk- 9.7%  The 10-year ASCVD risk score Mikey Bussing DC Jr., et al., 2013) is: 6.3% --->   Based on BP that's well controlled.    Values used to calculate the score:     Age: 68 years     Sex: Female     Is Non-Hispanic African American: No     Diabetic: No     Tobacco smoker: No     Systolic Blood Pressure: 374 mmHg     Is BP treated: No     HDL Cholesterol: 67 mg/dL     Total Cholesterol: 196 mg/dL     Wt Readings from Last 3 Encounters:  04/14/18 125 lb (56.7 kg)  03/28/18 125 lb 6.4 oz (56.9 kg)  04/29/16 129 lb (58.5 kg)   BP Readings from Last 3 Encounters:  09/12/18 119/79  04/14/18 (!) 150/85  03/28/18 (!) 155/92   Pulse Readings from Last 3 Encounters:  04/14/18 79  03/28/18 79  05/31/17 82   BMI Readings from Last 3 Encounters:  04/14/18 19.58 kg/m  03/28/18 19.64 kg/m  04/29/16  20.20 kg/m     Patient Care Team    Relationship Specialty Notifications Start End  Mellody Dance, DO PCP - General Family Medicine  04/29/16   Wonda Horner, MD Consulting Physician Gastroenterology  04/29/16   Tanda Rockers, MD Consulting Physician Pulmonary Disease  04/29/16      Patient Active Problem List   Diagnosis Date Noted  . Refuses treatment- BP and Chol meds 04/14/2018    Priority: High  . White coat syndrome with high blood pressure but without hypertension 09/12/2018  . Health education/counseling 09/12/2018  . HTN (hypertension) with goal to be determined 04/14/2018  . Hyperlipidemia 04/14/2018  . Noncompliance 04/14/2018  . 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 Vitamin D deficiency 05/19/2016  . Family history of hypertension 04/29/2016  . Family history of hyperlipidemia 04/29/2016  . Family history of colonic polyps 04/29/2016     No outpatient medications have been marked as taking for the 09/12/18 encounter (Office Visit) with Mellody Dance, DO.     Allergies:  No Known Allergies   ROS:  See above HPI for pertinent positives and negatives   Objective:   Blood pressure 119/79. There is  no height or weight on file to calculate BMI.    General: sounds in no acute distress.  Skin: Pt confirms warm and dry  extremities and pink fingertips Respiratory: speaking in full sentences, no conversational dyspnea Psych: A and O *3, appears insight good, mood- full      Impression and Recommendations:    1. Hyperlipidemia, unspecified hyperlipidemia type Long d/c pt re: ways to improve her ASCVDrisk and LDL -Extensively discussed importance of diet / exercise as pertains to pt's Bp and chol.  All pt's questions/ concerns were addressed.     2. White coat syndrome with high blood pressure but without hypertension -Check your BP twice wkly, with HR- write it down and call us w results  With goal not met of 135/85 - told pt to monitor twice wkly!  BRING  IN BP LOG NEXT OV - told to go to [facility] website for details on dietary changes, lifestyle etc.    3. Health education/counseling - Novel Covid -19 counseling done; all questions were answered.   - Reminded pt of extreme importance of social distancing; minimizing contacts with others etc etc - told to call with any concerns     I discussed the assessment and treatment plan with the patient. The patient was provided an opportunity to ask questions and all were answered. The patient agreed with the plan and demonstrated an understanding of the instructions.   The patient was advised to call back or seek an in-person evaluation if the symptoms worsen or if the condition fails to improve as anticipated.  Expresses verbal understanding and consents to current therapy and treatment regimen.  No barriers to understanding were identified.  Red flag symptoms and signs discussed in detail.  Patient expressed understanding regarding what to do in case of emergency\urgent symptoms  Please see AVS handed out to patient at the end of our visit for further patient instructions/ counseling done pertaining to today's office visit.  I provided 30+  minutes of non-face-to-face time during this encounter.     Mellody Dance, DO

## 2019-05-19 ENCOUNTER — Other Ambulatory Visit: Payer: Self-pay | Admitting: Family Medicine

## 2019-05-19 DIAGNOSIS — Z1231 Encounter for screening mammogram for malignant neoplasm of breast: Secondary | ICD-10-CM

## 2019-07-10 DIAGNOSIS — D225 Melanocytic nevi of trunk: Secondary | ICD-10-CM | POA: Diagnosis not present

## 2019-07-10 DIAGNOSIS — Z1283 Encounter for screening for malignant neoplasm of skin: Secondary | ICD-10-CM | POA: Diagnosis not present

## 2019-07-12 ENCOUNTER — Ambulatory Visit
Admission: RE | Admit: 2019-07-12 | Discharge: 2019-07-12 | Disposition: A | Discharge status: Home / Self Care | Payer: Medicare PPO | Source: Ambulatory Visit | Attending: Family Medicine | Admitting: Family Medicine

## 2019-07-12 ENCOUNTER — Other Ambulatory Visit: Payer: Self-pay

## 2019-07-12 DIAGNOSIS — Z1231 Encounter for screening mammogram for malignant neoplasm of breast: Secondary | ICD-10-CM

## 2020-09-04 DIAGNOSIS — H40013 Open angle with borderline findings, low risk, bilateral: Secondary | ICD-10-CM | POA: Diagnosis not present

## 2020-10-04 DIAGNOSIS — H2511 Age-related nuclear cataract, right eye: Secondary | ICD-10-CM | POA: Diagnosis not present

## 2020-10-04 DIAGNOSIS — H2513 Age-related nuclear cataract, bilateral: Secondary | ICD-10-CM | POA: Diagnosis not present

## 2020-10-04 DIAGNOSIS — H18413 Arcus senilis, bilateral: Secondary | ICD-10-CM | POA: Diagnosis not present

## 2020-10-04 DIAGNOSIS — H25013 Cortical age-related cataract, bilateral: Secondary | ICD-10-CM | POA: Diagnosis not present

## 2020-10-04 DIAGNOSIS — H40013 Open angle with borderline findings, low risk, bilateral: Secondary | ICD-10-CM | POA: Diagnosis not present

## 2020-10-04 DIAGNOSIS — H25043 Posterior subcapsular polar age-related cataract, bilateral: Secondary | ICD-10-CM | POA: Diagnosis not present

## 2020-10-09 DIAGNOSIS — L820 Inflamed seborrheic keratosis: Secondary | ICD-10-CM | POA: Diagnosis not present

## 2020-10-09 DIAGNOSIS — Z1283 Encounter for screening for malignant neoplasm of skin: Secondary | ICD-10-CM | POA: Diagnosis not present

## 2020-10-09 DIAGNOSIS — D225 Melanocytic nevi of trunk: Secondary | ICD-10-CM | POA: Diagnosis not present

## 2020-10-09 DIAGNOSIS — H40052 Ocular hypertension, left eye: Secondary | ICD-10-CM | POA: Diagnosis not present

## 2020-11-12 DIAGNOSIS — H401111 Primary open-angle glaucoma, right eye, mild stage: Secondary | ICD-10-CM | POA: Diagnosis not present

## 2020-12-02 DIAGNOSIS — H2511 Age-related nuclear cataract, right eye: Secondary | ICD-10-CM | POA: Diagnosis not present

## 2020-12-03 DIAGNOSIS — H2512 Age-related nuclear cataract, left eye: Secondary | ICD-10-CM | POA: Diagnosis not present

## 2020-12-23 DIAGNOSIS — H2512 Age-related nuclear cataract, left eye: Secondary | ICD-10-CM | POA: Diagnosis not present

## 2021-03-03 DIAGNOSIS — H401131 Primary open-angle glaucoma, bilateral, mild stage: Secondary | ICD-10-CM | POA: Diagnosis not present

## 2021-06-02 DIAGNOSIS — H401131 Primary open-angle glaucoma, bilateral, mild stage: Secondary | ICD-10-CM | POA: Diagnosis not present

## 2021-06-20 DIAGNOSIS — H40013 Open angle with borderline findings, low risk, bilateral: Secondary | ICD-10-CM | POA: Diagnosis not present

## 2021-07-11 DIAGNOSIS — Z961 Presence of intraocular lens: Secondary | ICD-10-CM | POA: Diagnosis not present

## 2021-07-11 DIAGNOSIS — H40013 Open angle with borderline findings, low risk, bilateral: Secondary | ICD-10-CM | POA: Diagnosis not present

## 2021-08-22 DIAGNOSIS — Z1211 Encounter for screening for malignant neoplasm of colon: Secondary | ICD-10-CM | POA: Diagnosis not present

## 2021-09-29 DIAGNOSIS — H401131 Primary open-angle glaucoma, bilateral, mild stage: Secondary | ICD-10-CM | POA: Diagnosis not present

## 2021-10-13 DIAGNOSIS — H401111 Primary open-angle glaucoma, right eye, mild stage: Secondary | ICD-10-CM | POA: Diagnosis not present

## 2021-10-13 DIAGNOSIS — Z961 Presence of intraocular lens: Secondary | ICD-10-CM | POA: Diagnosis not present

## 2021-10-28 IMAGING — MG DIGITAL SCREENING BILAT W/ TOMO W/ CAD
6 of 10 series · 6 of 30 positions shown · non-contrast
Comparison: Previous exam(s).

CLINICAL DATA: Screening.

EXAM:
DIGITAL SCREENING BILATERAL MAMMOGRAM WITH TOMO AND CAD

[R CC synth-2D]
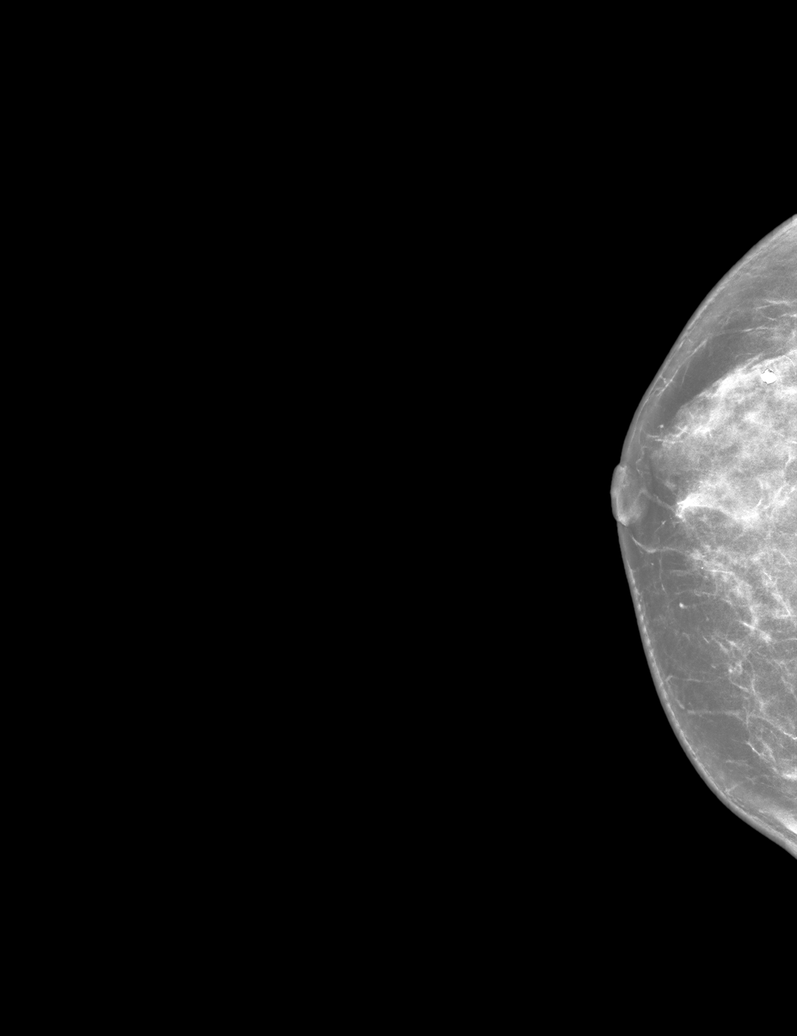

[R MLO synth-2D (1 of 2)]
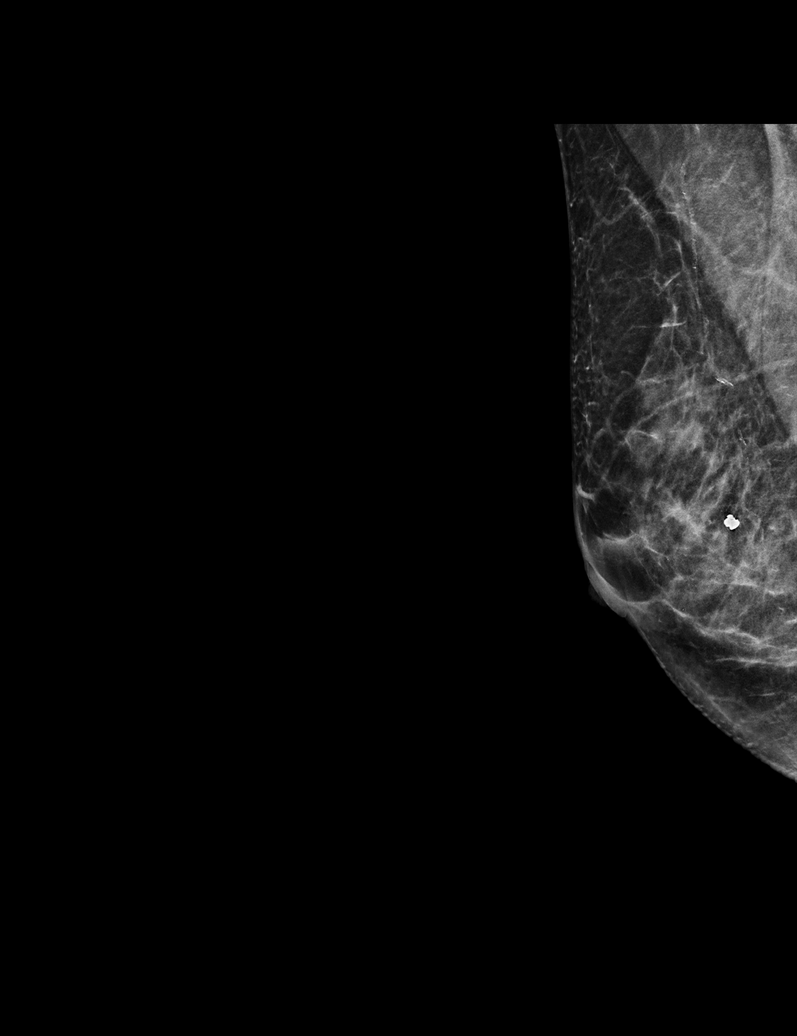

[L MLO synth-2D]
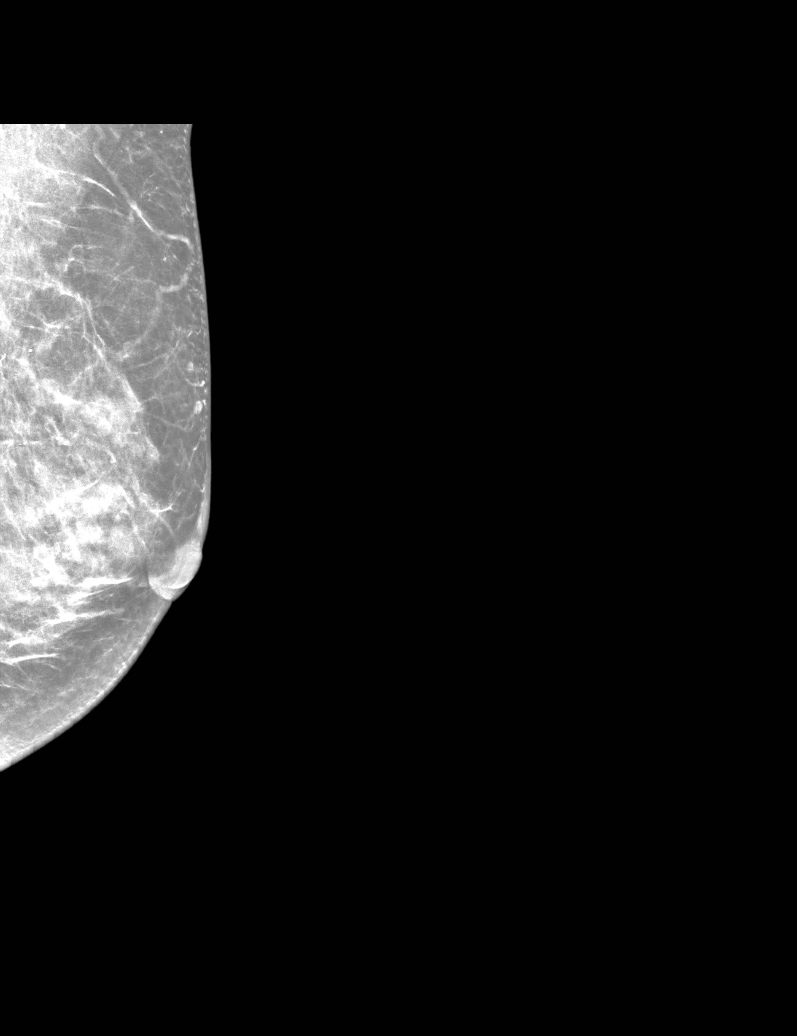

[R MLO synth-2D (2 of 2)]
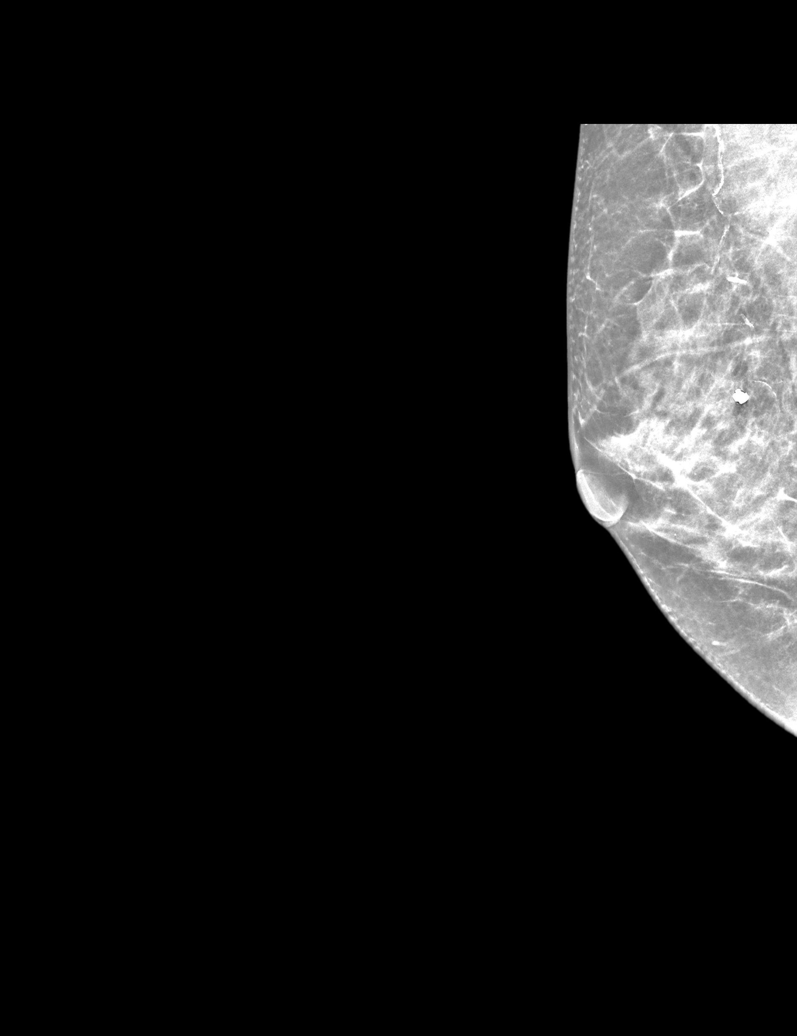

[L CC synth-2D]
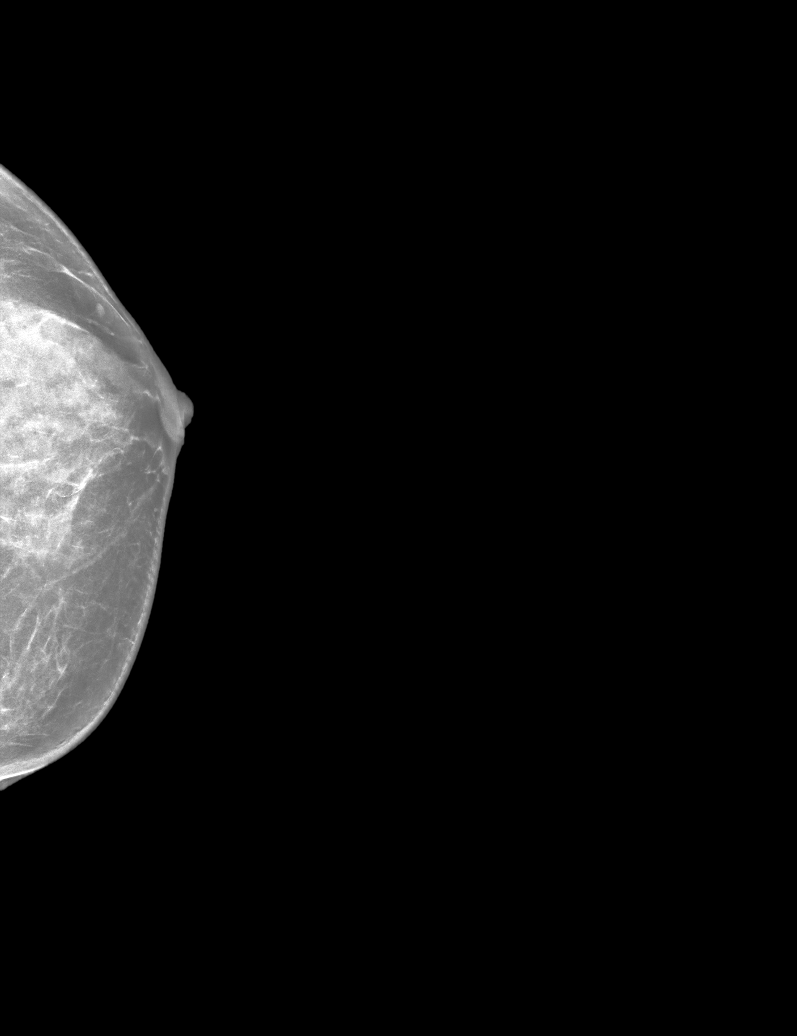

[R MLO tomo · tomo slice 27/53.0]
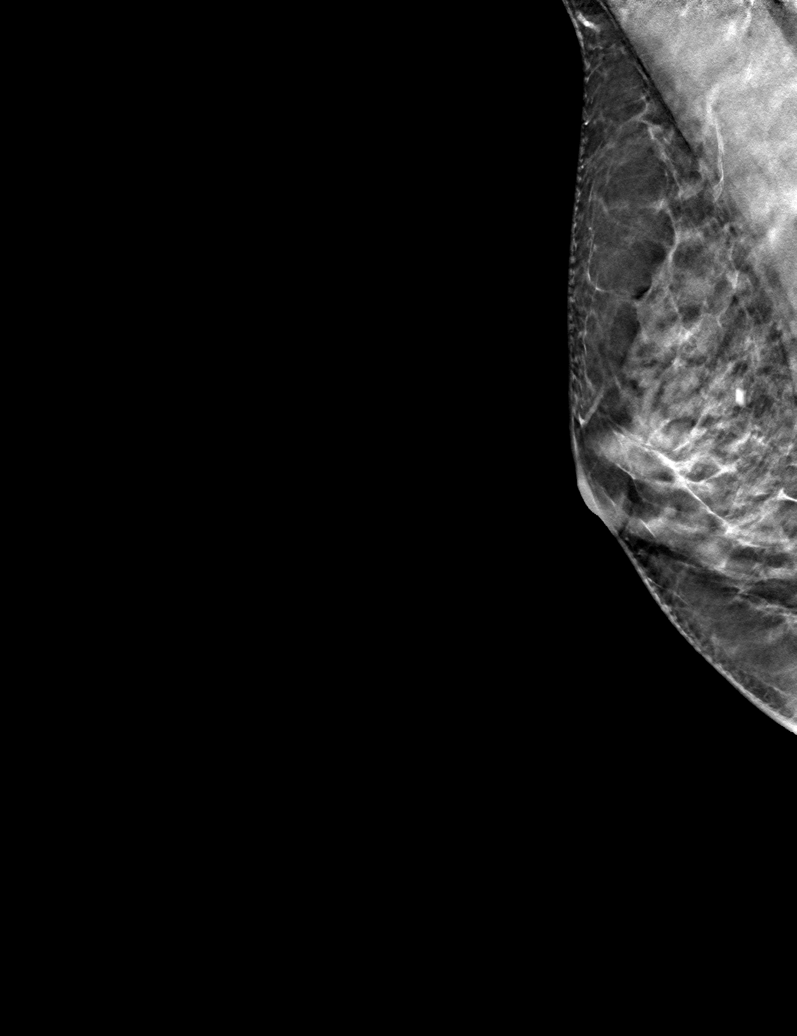

[6 of 30 positions shown; findings below may reference images not displayed]

ACR Breast Density Category c: The breast tissue is heterogeneously
dense, which may obscure small masses.
FINDINGS: There are no findings suspicious for malignancy. Images were
processed with CAD.
IMPRESSION: No mammographic evidence of malignancy. A result letter of this
screening mammogram will be mailed directly to the patient.

RECOMMENDATION:
Screening mammogram in one year. (Code:[id number])

BI-RADS CATEGORY  1: Negative.

## 2021-12-08 DIAGNOSIS — H401111 Primary open-angle glaucoma, right eye, mild stage: Secondary | ICD-10-CM | POA: Diagnosis not present

## 2021-12-08 DIAGNOSIS — Z961 Presence of intraocular lens: Secondary | ICD-10-CM | POA: Diagnosis not present

## 2022-02-05 ENCOUNTER — Emergency Department (HOSPITAL_COMMUNITY): Payer: Medicare PPO

## 2022-02-05 ENCOUNTER — Inpatient Hospital Stay (HOSPITAL_COMMUNITY): Payer: Medicare PPO

## 2022-02-05 ENCOUNTER — Inpatient Hospital Stay (HOSPITAL_COMMUNITY)
Admission: EM | Admit: 2022-02-05 | Discharge: 2022-02-07 | DRG: 063 | Disposition: A | Discharge status: Home / Self Care | Payer: Medicare PPO | Attending: Neurology | Admitting: Neurology

## 2022-02-05 DIAGNOSIS — R1111 Vomiting without nausea: Secondary | ICD-10-CM | POA: Diagnosis not present

## 2022-02-05 DIAGNOSIS — I639 Cerebral infarction, unspecified: Secondary | ICD-10-CM | POA: Diagnosis not present

## 2022-02-05 DIAGNOSIS — Z8249 Family history of ischemic heart disease and other diseases of the circulatory system: Secondary | ICD-10-CM

## 2022-02-05 DIAGNOSIS — E785 Hyperlipidemia, unspecified: Secondary | ICD-10-CM | POA: Diagnosis not present

## 2022-02-05 DIAGNOSIS — R9431 Abnormal electrocardiogram [ECG] [EKG]: Secondary | ICD-10-CM | POA: Diagnosis not present

## 2022-02-05 DIAGNOSIS — R739 Hyperglycemia, unspecified: Secondary | ICD-10-CM | POA: Diagnosis present

## 2022-02-05 DIAGNOSIS — R270 Ataxia, unspecified: Secondary | ICD-10-CM | POA: Diagnosis not present

## 2022-02-05 DIAGNOSIS — Z83438 Family history of other disorder of lipoprotein metabolism and other lipidemia: Secondary | ICD-10-CM | POA: Diagnosis not present

## 2022-02-05 DIAGNOSIS — Z91148 Patient's other noncompliance with medication regimen for other reason: Secondary | ICD-10-CM | POA: Diagnosis not present

## 2022-02-05 DIAGNOSIS — R42 Dizziness and giddiness: Secondary | ICD-10-CM | POA: Diagnosis not present

## 2022-02-05 DIAGNOSIS — R112 Nausea with vomiting, unspecified: Secondary | ICD-10-CM | POA: Diagnosis not present

## 2022-02-05 DIAGNOSIS — I10 Essential (primary) hypertension: Secondary | ICD-10-CM | POA: Diagnosis present

## 2022-02-05 DIAGNOSIS — R29700 NIHSS score 0: Secondary | ICD-10-CM | POA: Diagnosis present

## 2022-02-05 DIAGNOSIS — H5500 Unspecified nystagmus: Secondary | ICD-10-CM | POA: Diagnosis present

## 2022-02-05 DIAGNOSIS — Z9282 Status post administration of tPA (rtPA) in a different facility within the last 24 hours prior to admission to current facility: Secondary | ICD-10-CM | POA: Diagnosis not present

## 2022-02-05 DIAGNOSIS — I6389 Other cerebral infarction: Secondary | ICD-10-CM | POA: Diagnosis not present

## 2022-02-05 DIAGNOSIS — E7800 Pure hypercholesterolemia, unspecified: Secondary | ICD-10-CM | POA: Diagnosis not present

## 2022-02-05 DIAGNOSIS — R110 Nausea: Secondary | ICD-10-CM | POA: Diagnosis not present

## 2022-02-05 DIAGNOSIS — R29818 Other symptoms and signs involving the nervous system: Secondary | ICD-10-CM | POA: Diagnosis not present

## 2022-02-05 LAB — DIFFERENTIAL
Abs Immature Granulocytes: 0.04 10*3/uL (ref 0.00–0.07)
Basophils Absolute: 0 10*3/uL (ref 0.0–0.1)
Basophils Relative: 0 %
Eosinophils Absolute: 0.1 10*3/uL (ref 0.0–0.5)
Eosinophils Relative: 1 %
Immature Granulocytes: 0 %
Lymphocytes Relative: 12 %
Lymphs Abs: 1.1 10*3/uL (ref 0.7–4.0)
Monocytes Absolute: 0.4 10*3/uL (ref 0.1–1.0)
Monocytes Relative: 4 %
Neutro Abs: 7.4 10*3/uL (ref 1.7–7.7)
Neutrophils Relative %: 83 %

## 2022-02-05 LAB — I-STAT CHEM 8, ED
BUN: 13 mg/dL (ref 8–23)
Calcium, Ion: 1.11 mmol/L — ABNORMAL LOW (ref 1.15–1.40)
Chloride: 103 mmol/L (ref 98–111)
Creatinine, Ser: 0.9 mg/dL (ref 0.44–1.00)
Glucose, Bld: 128 mg/dL — ABNORMAL HIGH (ref 70–99)
HCT: 45 % (ref 36.0–46.0)
Hemoglobin: 15.3 g/dL — ABNORMAL HIGH (ref 12.0–15.0)
Potassium: 3.4 mmol/L — ABNORMAL LOW (ref 3.5–5.1)
Sodium: 144 mmol/L (ref 135–145)
TCO2: 26 mmol/L (ref 22–32)

## 2022-02-05 LAB — CBC
HCT: 42.4 % (ref 36.0–46.0)
Hemoglobin: 14.3 g/dL (ref 12.0–15.0)
MCH: 30.8 pg (ref 26.0–34.0)
MCHC: 33.7 g/dL (ref 30.0–36.0)
MCV: 91.4 fL (ref 80.0–100.0)
Platelets: 255 10*3/uL (ref 150–400)
RBC: 4.64 MIL/uL (ref 3.87–5.11)
RDW: 12.9 % (ref 11.5–15.5)
WBC: 9 10*3/uL (ref 4.0–10.5)
nRBC: 0 % (ref 0.0–0.2)

## 2022-02-05 LAB — ETHANOL: Alcohol, Ethyl (B): <10 mg/dL (ref <10)

## 2022-02-05 LAB — PROTIME-INR
INR: 1.1 (ref 0.8–1.2)
Prothrombin Time: 13.8 seconds (ref 11.4–15.2)

## 2022-02-05 LAB — APTT: aPTT: 27 seconds (ref 24–36)

## 2022-02-05 MED ORDER — ACETAMINOPHEN 160 MG/5ML PO SOLN: 650.0000 mg | ORAL | Status: DC | PRN

## 2022-02-05 MED ORDER — PANTOPRAZOLE SODIUM 40 MG IV SOLR
40.0000 mg | Freq: Every day | INTRAVENOUS | Status: DC
  Administered 2022-02-05: 40 mg via INTRAVENOUS
  Filled 2022-02-05: qty 10

## 2022-02-05 MED ORDER — SENNOSIDES-DOCUSATE SODIUM 8.6-50 MG PO TABS: 1.0000 | ORAL_TABLET | Freq: Every evening | ORAL | Status: DC | PRN

## 2022-02-05 MED ORDER — LABETALOL HCL 5 MG/ML IV SOLN: 10.0000 mg | INTRAVENOUS | Status: DC | PRN

## 2022-02-05 MED ORDER — ACETAMINOPHEN 650 MG RE SUPP: 650.0000 mg | RECTAL | Status: DC | PRN

## 2022-02-05 MED ORDER — SODIUM CHLORIDE 0.9 % IV SOLN: INTRAVENOUS | Status: DC

## 2022-02-05 MED ORDER — ACETAMINOPHEN 325 MG PO TABS
650.0000 mg | ORAL_TABLET | ORAL | Status: DC | PRN
  Administered 2022-02-06 (×2): 325 mg via ORAL
  Administered 2022-02-07: 650 mg via ORAL
  Filled 2022-02-05 (×3): qty 2

## 2022-02-05 MED ORDER — STROKE: EARLY STAGES OF RECOVERY BOOK
Freq: Once | Status: AC
  Filled 2022-02-05: qty 1

## 2022-02-05 MED ORDER — IOHEXOL 350 MG/ML SOLN
60.0000 mL | Freq: Once | INTRAVENOUS | Status: AC | PRN
Start: 2022-02-05 — End: 2022-02-05
  Administered 2022-02-05: 60 mL via INTRAVENOUS

## 2022-02-05 MED ORDER — TENECTEPLASE FOR STROKE
0.2500 mg/kg | PACK | Freq: Once | INTRAVENOUS | Status: AC
Start: 2022-02-05 — End: 2022-02-05
  Administered 2022-02-05: 14 mg via INTRAVENOUS
  Filled 2022-02-05: qty 10

## 2022-02-05 NOTE — ED Notes (Signed)
Patient transported to CT 

## 2022-02-05 NOTE — H&P (Addendum)
Neurology Consultation Reason for Consult: Acute onset dizziness Requesting Physician:   CC: Dizziness  History is obtained from: Patient and EMS  HPI: Ashley Gutierrez is a 71 y.o. woman with a past medical history significant for hypertension, hyperlipidemia, medication nonadherence, limited medical follow-up, who presents with acute onset dizziness  She reports she was eating dinner with some friends when she started feel lightheaded around 8:30 PM.  She therefore got up from the table and was noted to be ataxic by her friends.  She had nausea and vomiting, does not think that this was triggered in any way by the food she was eating.  She denies headache or any other symptoms.  She was noted by EMS to have some left beating nystagmus on left gaze, with mildly elevated blood pressures.  She was brought to the ED for evaluation as code stroke.  Initially she reported that her symptoms of dizziness were present with movement and better at rest, but on my evaluation she continued to have waves of acute dizziness, nausea and retching even when laying still.  LKW: 8:30 PM tPA given?: No, or if yes, time given 11:10 PM IA performed?: No, no LVO Premorbid modified rankin scale:      0 - No symptoms.  ROS: All other review of systems was negative except as noted in the HPI.   Past Medical History:  Diagnosis Date   Pneumonia    No past surgical history on file.  Family History  Problem Relation Age of Onset   Diabetes Mother    Hypertension Mother    Hyperlipidemia Mother    Social History:  reports that she has never smoked. She has never used smokeless tobacco. She reports that she does not drink alcohol and does not use drugs.  Exam: Current vital signs: BP (!) 168/94   Pulse 75   Resp 18   Wt 56.6 kg   BMI 19.54 kg/m  Vital signs in last 24 hours: Pulse Rate:  [75] 75 (08/24 2307) Resp:  [18] 18 (08/24 2307) BP: (168)/(94) 168/94 (08/24 2307) Weight:  [56.6 kg] 56.6 kg  (08/24 2200)   Physical Exam  Constitutional: Thin but well-developed and well-nourished.  Psych: Affect appropriate to situation, mildly anxious but cooperative Eyes: No scleral injection HENT: No oropharyngeal obstruction.  MSK: no joint deformities.  Cardiovascular: Normal rate and regular rhythm. Perfusing extremities well Respiratory: Effort normal, non-labored breathing GI: Soft.  No distension. There is no tenderness (initially reported some discomfort but this was not present on repeat evaluation) Skin: Warm dry and intact visible skin  Neuro: Mental Status: Patient is awake, alert, oriented to person, place, month, year, and situation. Patient is able to give a clear and coherent history. No signs of aphasia or neglect Cranial Nerves: II: Visual Fields are full. Pupils are equal, round, and reactive to light.   III,IV, VI: EOMI without ptosis or diploplia.  No nystagmus on my evaluation.  No skew deviation.  Dix-Hallpike maneuver did not elicit any nystagmus bilaterally V: Facial sensation is symmetric to light touch VII: Facial movement is symmetric.  VIII: hearing is intact to voice X: Uvula elevates symmetrically XI: Shoulder shrug is symmetric. XII: tongue is midline without atrophy or fasciculations.  Motor: Tone is normal. Bulk is normal. 5/5 strength was present in all four extremities.  Sensory: Sensation is symmetric to light touch and temperature in the arms and legs. Deep Tendon Reflexes: 2+ and symmetric in the brachioradialis and patellae.  Plantars: Toes  are downgoing bilaterally.  Cerebellar: FNF and HKS are intact bilaterally Gait:  Attempted to ambulate patient but she felt too dizzy/nauseated to even try  NIHSS total 0 Performed at time of patient arrival to ED    I have reviewed labs in epic and the results pertinent to this consultation are:  Basic Metabolic Panel: Recent Labs  Lab 02/05/22 2254  NA 144  K 3.4*  CL 103  GLUCOSE 128*   BUN 13  CREATININE 0.90    CBC: Recent Labs  Lab 02/05/22 2253 02/05/22 2254  WBC 9.0  --   NEUTROABS 7.4  --   HGB 14.3 15.3*  HCT 42.4 45.0  MCV 91.4  --   PLT 255  --     Coagulation Studies: Recent Labs    02/05/22 09-13-2251  LABPROT 13.8  INR 1.1    EKG reviewed, automated read concerning for biatrial enlargement  I have reviewed the images obtained:  Head CT personally reviewed, agree with radiology no acute intracranial process CTA personally reviewed, agree with radiology no LVO or high-grade stenosis  Impression: Concern for acute brainstem stroke.  Differential includes cardiac etiology but patient denies any recent cardiac symptoms.  Does not appear to be peripheral based on my examination  Recommendations:  # Possible posterior circulation stroke - EKG and troponin x 2 to rule out acute cardiac process - Stroke labs TSH,  HgbA1c, fasting lipid panel - MRI brain 24 hours post TNK - Frequent neuro checks - Echocardiogram - Hold antiplatelet therapy until 24 hours post TNK and stable - Risk factor modification, importance of medication adherence and regular follow-up discussed with patient - Telemetry monitoring - Blood pressure goal   - Post tPA for 24  hours < 180/105 - PT consult, OT consult, Speech consult, unless patient is back to baseline - Admitted to stroke team, ICU   Lesleigh Noe MD-PhD [facility] [phone] Available 7 PM to 7 AM, outside of these hours please call Neurologist on call as listed on Amion.   CRITICAL CARE Performed by: Lorenza Chick   Total critical care time: 45 minutes  Critical care time was exclusive of separately billable procedures and treating other patients.  Critical care was necessary to treat or prevent imminent or life-threatening deterioration.  Critical care was time spent personally by me on the following activities: development of treatment plan with patient and/or surrogate as well as  nursing, discussions with consultants, evaluation of patient's response to treatment, examination of patient, obtaining history from patient or surrogate, ordering and performing treatments and interventions, ordering and review of laboratory studies, ordering and review of radiographic studies, pulse oximetry and re-evaluation of patient's condition.

## 2022-02-05 NOTE — ED Notes (Signed)
Patient back in room and connected to cardiac monitor. Primary RN and Bhagat MD Neuro at bedside

## 2022-02-05 NOTE — ED Triage Notes (Signed)
Pt BIB EMS reports being at dinner with friends and started feeling dizzy and stood up and vomited. Dizziness continued upon arrival

## 2022-02-05 NOTE — ED Provider Notes (Signed)
[facility] EMERGENCY DEPARTMENT Provider Note   CSN: [id number] Arrival date & time: 02/05/22  2248  An emergency department physician performed an initial assessment on this suspected stroke patient at 2248.  History  No chief complaint on file.   Ashley Gutierrez is a 71 y.o. female who presents as a code stroke after sudden onset room spinning dizziness and ataxia at 8:45 PM tonight.  Patient was at a party.  Her friends called EMS.  EMS reports that the patient had a leftward gaze.  She continues to feel extremely dizzy and has had several episodes of vomiting.  Code stroke initiated.  Patient evaluated by Dr. Curly Shores and is currently receiving TNK.  HPI     Home Medications Prior to Admission medications   Medication Sig Start Date End Date Taking? Authorizing Provider  Cholecalciferol (VITAMIN D PO) Take 1 capsule by mouth.    [provider]  Multiple Vitamin (MULTIVITAMIN) capsule Take 1 capsule by mouth daily.    [provider]      Allergies    Patient has no known allergies.    Review of Systems   Review of Systems  Physical Exam Updated Vital Signs Wt 56.6 kg   BMI 19.54 kg/m  Physical Exam Vitals and nursing note reviewed.  Constitutional:      General: She is not in acute distress.    Appearance: She is well-developed. She is not diaphoretic.     Comments: Patient sitting upright on examining table.  She is holding an emesis bag near her mouth  HENT:     Head: Normocephalic and atraumatic.     Right Ear: External ear normal.     Left Ear: External ear normal.     Nose: Nose normal.     Mouth/Throat:     Mouth: Mucous membranes are moist.  Eyes:     General: No scleral icterus.    Extraocular Movements: Extraocular movements intact.     Conjunctiva/sclera: Conjunctivae normal.     Pupils: Pupils are equal, round, and reactive to light.     Comments: No obvious nystagmus  Cardiovascular:     Rate and Rhythm: Normal  rate and regular rhythm.     Heart sounds: Normal heart sounds. No murmur heard.    No friction rub. No gallop.  Pulmonary:     Effort: Pulmonary effort is normal. No respiratory distress.     Breath sounds: Normal breath sounds.  Abdominal:     General: Bowel sounds are normal. There is no distension.     Palpations: Abdomen is soft. There is no mass.     Tenderness: There is no abdominal tenderness. There is no guarding.  Musculoskeletal:     Cervical back: Normal range of motion.  Skin:    General: Skin is warm and dry.  Neurological:     Mental Status: She is alert and oriented to person, place, and time.     Cranial Nerves: No cranial nerve deficit.     Sensory: No sensory deficit.     Motor: No weakness.     Comments: Please see full neurologic exam by Dr.Bhagat  Psychiatric:        Behavior: Behavior normal.     ED Results / Procedures / Treatments   Labs (all labs ordered are listed, but only abnormal results are displayed) Labs Reviewed  I-STAT CHEM 8, ED - Abnormal; Notable for the following components:      Result Value  Potassium 3.4 (*)    Glucose, Bld 128 (*)    Calcium, Ion 1.11 (*)    Hemoglobin 15.3 (*)    All other components within normal limits  RESP PANEL BY RT-PCR (FLU A&B, COVID) ARPGX2  ETHANOL  PROTIME-INR  APTT  CBC  DIFFERENTIAL  COMPREHENSIVE METABOLIC PANEL  RAPID URINE DRUG SCREEN, HOSP PERFORMED  URINALYSIS, ROUTINE W REFLEX MICROSCOPIC    EKG None  Radiology CT HEAD CODE STROKE WO CONTRAST  Result Date: 02/05/2022 CLINICAL DATA:  Code stroke.  Nausea and vomiting EXAM: CT HEAD WITHOUT CONTRAST TECHNIQUE: Contiguous axial images were obtained from the base of the skull through the vertex without intravenous contrast. RADIATION DOSE REDUCTION: This exam was performed according to the departmental dose-optimization program which includes automated exposure control, adjustment of the mA and/or kV according to patient size and/or use of  iterative reconstruction technique. COMPARISON:  None Available. FINDINGS: Brain: There is no mass, hemorrhage or extra-axial collection. The size and configuration of the ventricles and extra-axial CSF spaces are normal. The brain parenchyma is normal, without evidence of acute or chronic infarction. Vascular: No abnormal hyperdensity of the major intracranial arteries or dural venous sinuses. No intracranial atherosclerosis. Skull: The visualized skull base, calvarium and extracranial soft tissues are normal. Sinuses/Orbits: No fluid levels or advanced mucosal thickening of the visualized paranasal sinuses. No mastoid or middle ear effusion. The orbits are normal. ASPECTS [facility] Stroke Program Early CT Score) - Ganglionic level infarction (caudate, lentiform nuclei, internal capsule, insula, M1-M3 cortex): 7 - Supraganglionic infarction (M4-M6 cortex): 3 Total score (0-10 with 10 being normal): 10 IMPRESSION: 1. No acute intracranial abnormality. 2. ASPECTS is 10. These results were communicated to Dr. Lesleigh Noe at 11:06 pm on 02/05/2022 by text page via the [facility] messaging system. Electronically Signed   By: Ulyses Jarred M.D.   On: 02/05/2022 23:06    Procedures .Critical Care  Performed by: Margarita Mail, PA-C Authorized by: Margarita Mail, PA-C   Critical care provider statement:    Critical care time (minutes):  30   Critical care time was exclusive of:  Separately billable procedures and treating other patients   Critical care was necessary to treat or prevent imminent or life-threatening deterioration of the following conditions:  CNS failure or compromise   Critical care was time spent personally by me on the following activities:  Development of treatment plan with patient or surrogate, discussions with consultants, evaluation of patient's response to treatment, examination of patient, ordering and review of laboratory studies, ordering and review of radiographic studies, ordering and  performing treatments and interventions, pulse oximetry, re-evaluation of patient's condition and review of old charts     Medications Ordered in ED Medications  tenecteplase (TNKASE) injection for Stroke 14 mg (has no administration in time range)    ED Course/ Medical Decision Making/ A&P                           Medical Decision Making Amount and/or Complexity of Data Reviewed Labs: ordered. Radiology: ordered.  Risk Decision regarding hospitalization.   Patient here with sudden onset ataxia room spinning dizziness and mental status change given TNK for suspected stroke by the stroke team.  I reviewed the patient's labs which are reassuring she has a mild hyperglycemia I also visualized and interpreted CT CT angiogram head and neck which showed no acute findings.  Patient will be admitted to the ICU by the stroke team  Final Clinical Impression(s) / ED Diagnoses Final diagnoses:  None    Rx / DC Orders ED Discharge Orders     None         Margarita Mail, PA-C 02/07/22 [place], [place], DO 02/07/22 [phone]

## 2022-02-05 NOTE — H&P (Incomplete)
Neurology Consultation Reason for Consult: Acute onset dizziness Requesting Physician:   CC: Dizziness  History is obtained from: Patient and EMS  HPI: Ashley Gutierrez is a 71 y.o. woman with a past medical history significant for hypertension, hyperlipidemia, medication nonadherence, limited medical follow-up, who presents with acute onset dizziness  She reports she was eating dinner with some friends when she started feel lightheaded around 8:30 PM.  She therefore got up from the table and was noted to be ataxic by her friends.  She had nausea and vomiting, does not think that this was triggered in any way by the food she was eating.  She denies headache or any other symptoms.  She was noted by EMS to have some left beating nystagmus on left gaze, with mildly elevated blood pressures.  She was brought to the ED for evaluation as code stroke.  Initially she reported that her symptoms of dizziness were present with movement and better at rest, but on my evaluation she continued to have waves of acute dizziness, nausea and retching even when laying still.  LKW: 8:30 PM tPA given?: No, or if yes, time given 11:10 PM IA performed?: No, no LVO Premorbid modified rankin scale:      0 - No symptoms.  ROS: All other review of systems was negative except as noted in the HPI.   Past Medical History:  Diagnosis Date  . Pneumonia    No past surgical history on file.  Family History  Problem Relation Age of Onset  . Diabetes Mother   . Hypertension Mother   . Hyperlipidemia Mother    Social History:  reports that she has never smoked. She has never used smokeless tobacco. She reports that she does not drink alcohol and does not use drugs.  Exam: Current vital signs: BP (!) 168/94   Pulse 75   Resp 18   Wt 56.6 kg   BMI 19.54 kg/m  Vital signs in last 24 hours: Pulse Rate:  [75] 75 (08/24 2307) Resp:  [18] 18 (08/24 2307) BP: (168)/(94) 168/94 (08/24 2307) Weight:  [56.6 kg] 56.6 kg  (08/24 2200)   Physical Exam  Constitutional: Thin but well-developed and well-nourished.  Psych: Affect appropriate to situation, mildly anxious but cooperative Eyes: No scleral injection HENT: No oropharyngeal obstruction.  MSK: no joint deformities.  Cardiovascular: Normal rate and regular rhythm. Perfusing extremities well Respiratory: Effort normal, non-labored breathing GI: Soft.  No distension. There is no tenderness (initially reported some discomfort but this was not present on repeat evaluation) Skin: Warm dry and intact visible skin  Neuro: Mental Status: Patient is awake, alert, oriented to person, place, month, year, and situation. Patient is able to give a clear and coherent history. No signs of aphasia or neglect Cranial Nerves: II: Visual Fields are full. Pupils are equal, round, and reactive to light.   III,IV, VI: EOMI without ptosis or diploplia.  No nystagmus on my evaluation.  No skew deviation.  Dix-Hallpike maneuver did not elicit any nystagmus bilaterally V: Facial sensation is symmetric to light touch VII: Facial movement is symmetric.  VIII: hearing is intact to voice X: Uvula elevates symmetrically XI: Shoulder shrug is symmetric. XII: tongue is midline without atrophy or fasciculations.  Motor: Tone is normal. Bulk is normal. 5/5 strength was present in all four extremities.  Sensory: Sensation is symmetric to light touch and temperature in the arms and legs. Deep Tendon Reflexes: 2+ and symmetric in the brachioradialis and patellae.  Plantars: Toes  are downgoing bilaterally.  Cerebellar: FNF and HKS are intact bilaterally Gait:  Attempted to ambulate patient but she felt too dizzy/nauseated to even try  NIHSS total 0 Performed at time of patient arrival to ED    I have reviewed labs in epic and the results pertinent to this consultation are:  Basic Metabolic Panel: Recent Labs  Lab 02/05/22 2254  NA 144  K 3.4*  CL 103  GLUCOSE 128*   BUN 13  CREATININE 0.90    CBC: Recent Labs  Lab 02/05/22 2253 02/05/22 2254  WBC 9.0  --   NEUTROABS 7.4  --   HGB 14.3 15.3*  HCT 42.4 45.0  MCV 91.4  --   PLT 255  --     Coagulation Studies: Recent Labs    02/05/22 24-Sep-2251  LABPROT 13.8  INR 1.1      I have reviewed the images obtained:  Head CT personally reviewed, agree with radiology no acute intracranial process CTA personally reviewed, agree with radiology no LVO or high-grade stenosis  Impression: Concern for acute brainstem stroke.  Differential includes cardiac etiology but patient denies any recent cardiac symptoms.  Recommendations: - ***   Lesleigh Noe MD-PhD [facility] [phone]   *** [facility], [facility], [facility]    Total critical care time: *** minutes   Critical care time was exclusive of separately billable procedures and treating other patients.   Critical care was necessary to treat or prevent imminent or life-threatening deterioration.   Critical care was time spent personally by me on the following activities: development of treatment plan with patient and/or surrogate as well as nursing, discussions with consultants/primary team, evaluation of patient's response to treatment, examination of patient, obtaining history from patient or surrogate, ordering and performing treatments and interventions, ordering and review of laboratory studies, ordering and review of radiographic studies, and re-evaluation of patient's condition as needed, as documented above.

## 2022-02-05 NOTE — Code Documentation (Signed)
Responded to Code Stroke called at 2236 for dizziness, N/V, and loss of coordination, LSN-2030. Pt arrived at 2248, CBG-128, NIH-0, CT head-negative for acute changes. TNK given at 2310. Plan admit to ICU.

## 2022-02-05 NOTE — Progress Notes (Signed)
PHARMACIST CODE STROKE RESPONSE  Notified to mix TNK at 2307 by Dr. Curly Shores TNK preparation completed at 2310  TNK dose = 14 mg IV over 5 seconds  Issues/delays encountered (if applicable): no applicable pharmacy related delays  Carolin Guernsey 02/05/22 11:13 PM

## 2022-02-06 ENCOUNTER — Other Ambulatory Visit: Payer: Self-pay

## 2022-02-06 ENCOUNTER — Encounter (HOSPITAL_COMMUNITY): Payer: Self-pay | Admitting: Neurology

## 2022-02-06 ENCOUNTER — Inpatient Hospital Stay (HOSPITAL_COMMUNITY): Payer: Medicare PPO

## 2022-02-06 DIAGNOSIS — E7800 Pure hypercholesterolemia, unspecified: Secondary | ICD-10-CM | POA: Diagnosis not present

## 2022-02-06 DIAGNOSIS — I6389 Other cerebral infarction: Secondary | ICD-10-CM

## 2022-02-06 DIAGNOSIS — Z9282 Status post administration of tPA (rtPA) in a different facility within the last 24 hours prior to admission to current facility: Secondary | ICD-10-CM

## 2022-02-06 DIAGNOSIS — I639 Cerebral infarction, unspecified: Secondary | ICD-10-CM | POA: Diagnosis not present

## 2022-02-06 LAB — HEMOGLOBIN A1C
Hgb A1c MFr Bld: 5.4 % (ref 4.8–5.6)
Mean Plasma Glucose: 108.28 mg/dL

## 2022-02-06 LAB — RAPID URINE DRUG SCREEN, HOSP PERFORMED
Amphetamines: NOT DETECTED
Barbiturates: NOT DETECTED
Benzodiazepines: NOT DETECTED
Cocaine: NOT DETECTED
Opiates: NOT DETECTED
Tetrahydrocannabinol: NOT DETECTED

## 2022-02-06 LAB — URINALYSIS, ROUTINE W REFLEX MICROSCOPIC
Bacteria, UA: NONE SEEN
Bilirubin Urine: NEGATIVE
Glucose, UA: NEGATIVE mg/dL
Ketones, ur: 5 mg/dL — AB
Leukocytes,Ua: NEGATIVE
Nitrite: NEGATIVE
Protein, ur: NEGATIVE mg/dL
Specific Gravity, Urine: 1.021 (ref 1.005–1.030)
pH: 7 (ref 5.0–8.0)

## 2022-02-06 LAB — COMPREHENSIVE METABOLIC PANEL
ALT: 19 U/L (ref 0–44)
AST: 19 U/L (ref 15–41)
Albumin: 4.1 g/dL (ref 3.5–5.0)
Alkaline Phosphatase: 79 U/L (ref 38–126)
Anion gap: 8 (ref 5–15)
BUN: 9 mg/dL (ref 8–23)
CO2: 26 mmol/L (ref 22–32)
Calcium: 8.9 mg/dL (ref 8.9–10.3)
Chloride: 106 mmol/L (ref 98–111)
Creatinine, Ser: 0.87 mg/dL (ref 0.44–1.00)
GFR, Estimated: >60 mL/min (ref >60)
Glucose, Bld: 164 mg/dL — ABNORMAL HIGH (ref 70–99)
Potassium: 3.5 mmol/L (ref 3.5–5.1)
Sodium: 140 mmol/L (ref 135–145)
Total Bilirubin: 0.5 mg/dL (ref 0.3–1.2)
Total Protein: 6.8 g/dL (ref 6.5–8.1)

## 2022-02-06 LAB — LIPID PANEL
Cholesterol: 193 mg/dL (ref 0–200)
HDL: 78 mg/dL (ref >40)
LDL Cholesterol: 109 mg/dL — ABNORMAL HIGH (ref 0–99)
Total CHOL/HDL Ratio: 2.5 RATIO
Triglycerides: 29 mg/dL (ref <150)
VLDL: 6 mg/dL (ref 0–40)

## 2022-02-06 LAB — ECHOCARDIOGRAM COMPLETE
Area-P 1/2: 3.7 cm2
Height: 66 in
S' Lateral: 2.4 cm
Weight: 1996.49 oz

## 2022-02-06 LAB — CBG MONITORING, ED: Glucose-Capillary: 133 mg/dL — ABNORMAL HIGH (ref 70–99)

## 2022-02-06 LAB — TROPONIN I (HIGH SENSITIVITY): Troponin I (High Sensitivity): 7 ng/L (ref <18)

## 2022-02-06 LAB — MRSA NEXT GEN BY PCR, NASAL: MRSA by PCR Next Gen: NOT DETECTED

## 2022-02-06 MED ORDER — ROSUVASTATIN CALCIUM 20 MG PO TABS
20.0000 mg | ORAL_TABLET | Freq: Every day | ORAL | Status: DC
  Administered 2022-02-06 – 2022-02-07 (×2): 20 mg via ORAL
  Filled 2022-02-06 (×2): qty 1

## 2022-02-06 MED ORDER — CHLORHEXIDINE GLUCONATE CLOTH 2 % EX PADS
6.0000 | MEDICATED_PAD | Freq: Every day | CUTANEOUS | Status: DC
  Administered 2022-02-06: 6 via TOPICAL

## 2022-02-06 MED ORDER — ORAL CARE MOUTH RINSE: 15.0000 mL | OROMUCOSAL | Status: DC | PRN

## 2022-02-06 MED ORDER — ONDANSETRON HCL 4 MG/2ML IJ SOLN
INTRAMUSCULAR | Status: AC
  Filled 2022-02-06: qty 2

## 2022-02-06 MED ORDER — PANTOPRAZOLE SODIUM 40 MG PO TBEC: 40.0000 mg | DELAYED_RELEASE_TABLET | Freq: Every day | ORAL | Status: DC

## 2022-02-06 NOTE — Progress Notes (Signed)
Echocardiogram 2D Echocardiogram has been performed.  Oneal Deputy Arlene Brickel RDCS 02/06/2022, 11:10 AM

## 2022-02-06 NOTE — Progress Notes (Signed)
OT Cancellation Note  Patient Details Name: Ashley Gutierrez MRN: [id number] DOB: 10/18/1950   Cancelled Treatment:    Reason Eval/Treat Not Completed: Active bedrest order (Will return as schedule allows. Thank you.)  [place], OTR/L Acute Rehab Office: [phone] 02/06/2022, 7:18 AM

## 2022-02-06 NOTE — Progress Notes (Signed)
  Transition of Care [facility]) Screening Note   Patient Details  Name: Ashley Gutierrez Date of Birth: [birth year]   Transition of Care [facility]) CM/SW Contact:    Ella Bodo, RN Phone Number: 02/06/2022, 5:29 PM    Transition of Care Department [facility]) has reviewed patient and no TOC needs have been identified at this time. We will continue to monitor patient advancement through interdisciplinary progression rounds. If new patient transition needs arise, please place a TOC consult.  Reinaldo Raddle, RN, BSN  Trauma/Neuro ICU Case Manager [phone]

## 2022-02-06 NOTE — Evaluation (Signed)
Occupational Therapy Evaluation and Discharge Patient Details Name: Ashley Gutierrez MRN: [id number] DOB: [birth year] Today's Date: 02/06/2022   History of Present Illness 71 yo female presents to [facility] on 8/24 with acute onset dizziness and ataxia. TPA given 2310 on 8/24, CTH negative and awaiting MRI. PMH includes HTN, HLD.   Clinical Impression   PTA, pt was living with her husband and was independent; enjoys spending time with her grandchildren and playing piano. Pt reporting she feels slightly fatigue and "just feeling tired". Pt demonstrating WFL for coordination, balance, vision, and cognition. Recommend dc to home once medically stable per physician. Providing education on BEFAST for stroke signs and symptoms. All acute OT needs met and will sign off.       Recommendations for follow up therapy are one component of a multi-disciplinary discharge planning process, led by the attending physician.  Recommendations may be updated based on patient status, additional functional criteria and insurance authorization.   Follow Up Recommendations  No OT follow up    Assistance Recommended at Discharge PRN  Patient can return home with the following      Functional Status Assessment  Patient has had a recent decline in their functional status and demonstrates the ability to make significant improvements in function in a reasonable and predictable amount of time.  Equipment Recommendations  None recommended by OT    Recommendations for Other Services       Precautions / Restrictions Precautions Precautions: Fall Restrictions Weight Bearing Restrictions: No      Mobility Bed Mobility Overal bed mobility: Modified Independent             General bed mobility comments: increased time    Transfers Overall transfer level: Modified independent                        Balance Overall balance assessment: Needs assistance Sitting-balance support: No upper extremity  supported, Feet supported Sitting balance-Leahy Scale: Good     Standing balance support: No upper extremity supported, During functional activity Standing balance-Leahy Scale: Fair                             ADL either performed or assessed with clinical judgement   ADL Overall ADL's : Modified independent                                       General ADL Comments: Increased time     Vision Baseline Vision/History: 1 Wears glasses       Perception     Praxis      Pertinent Vitals/Pain Pain Assessment Pain Assessment: No/denies pain     Hand Dominance Right   Extremity/Trunk Assessment Upper Extremity Assessment Upper Extremity Assessment: Overall WFL for tasks assessed   Lower Extremity Assessment Lower Extremity Assessment: Defer to PT evaluation   Cervical / Trunk Assessment Cervical / Trunk Assessment: Other exceptions Cervical / Trunk Exceptions: forward head, rounded shoulders   Communication Communication Communication: No difficulties   Cognition Arousal/Alertness: Awake/alert Behavior During Therapy: WFL for tasks assessed/performed Overall Cognitive Status: Within Functional Limits for tasks assessed                                 General Comments: Able to name three  animals that start with "F", simple money management question, and navigate around unit in hallway.     General Comments  Reviewed BEFAST for stroke signs and symptoms    Exercises     Shoulder Instructions      Home Living Family/patient expects to be discharged to:: Private residence Living Arrangements: Spouse/significant other Available Help at Discharge: Family Type of Home: House Home Access: Stairs to enter Technical brewer of Steps: 4   Home Layout: One level     Bathroom Shower/Tub: Occupational psychologist: Standard     Home Equipment: Radio producer - single point          Prior Functioning/Environment  Prior Level of Function : Independent/Modified Independent               ADLs Comments: Cooking, cleaning, driving; enjoys spending time with grandkids and playing piano        OT Problem List: Impaired balance (sitting and/or standing)      OT Treatment/Interventions:      OT Goals(Current goals can be found in the care plan section) Acute Rehab OT Goals Patient Stated Goal: Go home OT Goal Formulation: All assessment and education complete, DC therapy  OT Frequency:      Co-evaluation              AM-PAC OT "6 Clicks" Daily Activity     Outcome Measure Help from another person eating meals?: None Help from another person taking care of personal grooming?: None Help from another person toileting, which includes using toliet, bedpan, or urinal?: None Help from another person bathing (including washing, rinsing, drying)?: None Help from another person to put on and taking off regular upper body clothing?: None Help from another person to put on and taking off regular lower body clothing?: None 6 Click Score: 24   End of Session Equipment Utilized During Treatment: Gait belt Nurse Communication: Mobility status  Activity Tolerance: Patient tolerated treatment well Patient left: in chair;with call bell/phone within reach  OT Visit Diagnosis: Other abnormalities of gait and mobility (R26.89);Muscle weakness (generalized) (M62.81)                Time: 8335-8251 OT Time Calculation (min): 17 min Charges:  OT General Charges $OT Visit: 1 Visit OT Evaluation $OT Eval Moderate Complexity: 1 Mod  Ashley Gutierrez MSOT, OTR/L Acute Rehab Office: [place] 02/06/2022, 4:38 PM

## 2022-02-06 NOTE — Evaluation (Signed)
Physical Therapy Evaluation Patient Details Name: Ashley Gutierrez MRN: [id number] DOB: 09-12-1950 Today's Date: 02/06/2022  History of Present Illness  71 yo female presents to [facility] on 8/24 with acute onset dizziness and ataxia. TPA given 2310 on 8/24, CTH negative and awaiting MRI. PMH includes HTN, HLD.  Clinical Impression   Pt presents with min unsteadiness during mobility, decreased activity tolerance vs baseline. Pt to benefit from acute PT to address deficits. Pt ambulated short hallway distance with close guard, reporting early fatigue but no s/s dizziness present during gait even during head turns or directional changes. Pt with no reports of dizziness throughout vestibular testing or during any mobility. PT to progress mobility as tolerated, and will continue to follow acutely.         Recommendations for follow up therapy are one component of a multi-disciplinary discharge planning process, led by the attending physician.  Recommendations may be updated based on patient status, additional functional criteria and insurance authorization.  Follow Up Recommendations Outpatient PT (likely no follow up, pending progress)      Assistance Recommended at Discharge PRN  Patient can return home with the following  A little help with walking and/or transfers;A little help with bathing/dressing/bathroom    Equipment Recommendations None recommended by PT  Recommendations for Other Services       Functional Status Assessment Patient has had a recent decline in their functional status and demonstrates the ability to make significant improvements in function in a reasonable and predictable amount of time.     Precautions / Restrictions Precautions Precautions: Fall Restrictions Weight Bearing Restrictions: No      Mobility  Bed Mobility Overal bed mobility: Needs Assistance Bed Mobility: Supine to Sit, Sit to Supine     Supine to sit: Supervision Sit to supine: Supervision    General bed mobility comments: for safety, increased time and effort    Transfers Overall transfer level: Needs assistance Equipment used: None Transfers: Sit to/from Stand Sit to Stand: Min guard           General transfer comment: for safety, slow to rise and steady but anticpate this is out of caution    Ambulation/Gait Ambulation/Gait assistance: Min guard Gait Distance (Feet): 80 Feet Assistive device: None Gait Pattern/deviations: Step-through pattern, Decreased stride length, Narrow base of support Gait velocity: decr     General Gait Details: slowed with arms in higher guard position secondary to nervousness/for balance. No LOB during directional changes, head turns  Financial trader Rankin (Stroke Patients Only) Modified Rankin (Stroke Patients Only) Pre-Morbid Rankin Score: No symptoms Modified Rankin: Moderate disability     Balance Overall balance assessment: Needs assistance Sitting-balance support: No upper extremity supported, Feet supported Sitting balance-Leahy Scale: Good     Standing balance support: No upper extremity supported, During functional activity Standing balance-Leahy Scale: Fair                               Pertinent Vitals/Pain Pain Assessment Pain Assessment: No/denies pain    Home Living Family/patient expects to be discharged to:: Private residence Living Arrangements: Spouse/significant other Available Help at Discharge: Family Type of Home: House Home Access: Stairs to enter   Technical brewer of Steps: 4   Home Layout: One level Home Equipment: Kasandra Knudsen - single point      Prior Function Prior Level  of Function : Independent/Modified Independent             Mobility Comments: pt's husband does not mobilize well, uses RW. Pt states she does all of the household cooking, cleaning       Hand Dominance   Dominant Hand: Right    Extremity/Trunk  Assessment   Upper Extremity Assessment Upper Extremity Assessment: Defer to OT evaluation    Lower Extremity Assessment Lower Extremity Assessment: Overall WFL for tasks assessed (heel-to-shin bilat WFL)    Cervical / Trunk Assessment Cervical / Trunk Assessment: Other exceptions Cervical / Trunk Exceptions: forward head, rounded shoulders  Communication   Communication: No difficulties  Cognition Arousal/Alertness: Awake/alert Behavior During Therapy: WFL for tasks assessed/performed Overall Cognitive Status: Within Functional Limits for tasks assessed                                          General Comments General comments (skin integrity, edema, etc.): vestibular testing: HIT + for overcorrective saccades to R, L HIT WFL, saccades WFL, smooth pursuits WFL, modified dix hallpike screen [facility]    Exercises     Assessment/Plan    PT Assessment Patient needs continued PT services  PT Problem List Decreased mobility;Decreased activity tolerance;Decreased balance       PT Treatment Interventions DME instruction;Therapeutic activities;Gait training;Therapeutic exercise;Patient/family education;Balance training;Stair training;Functional mobility training;Neuromuscular re-education    PT Goals (Current goals can be found in the Care Plan section)  Acute Rehab PT Goals PT Goal Formulation: With patient Time For Goal Achievement: 02/20/22 Potential to Achieve Goals: Good    Frequency Min 4X/week     Co-evaluation               AM-PAC PT "6 Clicks" Mobility  Outcome Measure Help needed turning from your back to your side while in a flat bed without using bedrails?: None Help needed moving from lying on your back to sitting on the side of a flat bed without using bedrails?: None Help needed moving to and from a bed to a chair (including a wheelchair)?: A Little Help needed standing up from a chair using your arms (e.g., wheelchair or bedside chair)?: A  Little Help needed to walk in hospital room?: A Little Help needed climbing 3-5 steps with a railing? : A Little 6 Click Score: 20    End of Session   Activity Tolerance: Patient tolerated treatment well Patient left: in bed;with call bell/phone within reach;with bed alarm set;with family/visitor present Nurse Communication: Mobility status PT Visit Diagnosis: Other abnormalities of gait and mobility (R26.89);Dizziness and giddiness (R42)    Time: 1540-0867 PT Time Calculation (min) (ACUTE ONLY): 28 min   Charges:   PT Evaluation $PT Eval Low Complexity: 1 Low PT Treatments $Therapeutic Activity: 8-22 mins       Stacie Glaze, PT DPT Acute Rehabilitation Services Pager [phone]  Office [phone]   Roxine Caddy E Ruffin Pyo 02/06/2022, 1:36 PM

## 2022-02-06 NOTE — Progress Notes (Addendum)
STROKE TEAM PROGRESS NOTE   INTERVAL HISTORY Dizziness is resolved but hadn't gotten out of bed yet. Vestibular PT ordered.  TNK given at 2310 8/24  Vitals:   02/06/22 0530 02/06/22 0600 02/06/22 0630 02/06/22 0700  BP: (!) 146/76 127/71 (!) 143/72 126/75  Pulse: 70 64 68 68  Resp: '18 11 12 12  '$ Temp:      TempSrc:      SpO2: 99% 97% 97% 98%  Weight:      Height:       CBC:  Recent Labs  Lab 02/05/22 2253 02/05/22 2254  WBC 9.0  --   NEUTROABS 7.4  --   HGB 14.3 15.3*  HCT 42.4 45.0  MCV 91.4  --   PLT 255  --    Basic Metabolic Panel:  Recent Labs  Lab 02/05/22 2254 02/06/22 0215  NA 144 140  K 3.4* 3.5  CL 103 106  CO2  --  26  GLUCOSE 128* 164*  BUN 13 9  CREATININE 0.90 0.87  CALCIUM  --  8.9   Lipid Panel:  Recent Labs  Lab 02/06/22 0215  CHOL 193  TRIG 29  HDL 78  CHOLHDL 2.5  VLDL 6  LDLCALC 109*   HgbA1c:  Recent Labs  Lab 02/05/22 2355  HGBA1C 5.4   Urine Drug Screen:  Recent Labs  Lab 02/06/22 0356  LABOPIA NONE DETECTED  COCAINSCRNUR NONE DETECTED  LABBENZ NONE DETECTED  AMPHETMU NONE DETECTED  THCU NONE DETECTED  LABBARB NONE DETECTED    Alcohol Level  Recent Labs  Lab 02/05/22 2253  ETH <10    IMAGING past 24 hours CT ANGIO HEAD NECK W WO CM (CODE STROKE)  Result Date: 02/05/2022 CLINICAL DATA:  Nausea and vomiting.  Code stroke. EXAM: CT ANGIOGRAPHY HEAD AND NECK TECHNIQUE: Multidetector CT imaging of the head and neck was performed using the standard protocol during bolus administration of intravenous contrast. Multiplanar CT image reconstructions and MIPs were obtained to evaluate the vascular anatomy. Carotid stenosis measurements (when applicable) are obtained utilizing NASCET criteria, using the distal internal carotid diameter as the denominator. RADIATION DOSE REDUCTION: This exam was performed according to the departmental dose-optimization program which includes automated exposure control, adjustment of the mA  and/or kV according to patient size and/or use of iterative reconstruction technique. CONTRAST:  76m OMNIPAQUE IOHEXOL 350 MG/ML SOLN COMPARISON:  None Available. FINDINGS: CTA NECK FINDINGS SKELETON: There is no bony spinal canal stenosis. No lytic or blastic lesion. OTHER NECK: Normal pharynx, larynx and major salivary glands. No cervical lymphadenopathy. Unremarkable thyroid gland. UPPER CHEST: No pneumothorax or pleural effusion. No nodules or masses. AORTIC ARCH: There is no calcific atherosclerosis of the aortic arch. There is no aneurysm, dissection or hemodynamically significant stenosis of the visualized portion of the aorta. Normal variant aortic arch branching pattern with the left vertebral artery arising independently from the aortic arch. The visualized proximal subclavian arteries are widely patent. RIGHT CAROTID SYSTEM: Normal without aneurysm, dissection or stenosis. LEFT CAROTID SYSTEM: Normal without aneurysm, dissection or stenosis. VERTEBRAL ARTERIES: Left dominant configuration. Both origins are clearly patent. There is no dissection, occlusion or flow-limiting stenosis to the skull base (V1-V3 segments). CTA HEAD FINDINGS POSTERIOR CIRCULATION: --Vertebral arteries: Normal V4 segments. --Inferior cerebellar arteries: Normal. --Basilar artery: Normal. --Superior cerebellar arteries: Normal. --Posterior cerebral arteries (PCA): Normal. ANTERIOR CIRCULATION: --Intracranial internal carotid arteries: Normal. --Anterior cerebral arteries (ACA): Normal. Both A1 segments are present. Patent anterior communicating artery (a-comm). --Middle cerebral arteries (MCA):  Normal. VENOUS SINUSES: As permitted by contrast timing, patent. ANATOMIC VARIANTS: None Review of the MIP images confirms the above findings. IMPRESSION: No emergent large vessel occlusion or high-grade stenosis of the intracranial or cervical arteries. Electronically Signed   By: Ulyses Jarred M.D.   On: 02/05/2022 23:30   CT HEAD CODE  STROKE WO CONTRAST  Result Date: 02/05/2022 CLINICAL DATA:  Code stroke.  Nausea and vomiting EXAM: CT HEAD WITHOUT CONTRAST TECHNIQUE: Contiguous axial images were obtained from the base of the skull through the vertex without intravenous contrast. RADIATION DOSE REDUCTION: This exam was performed according to the departmental dose-optimization program which includes automated exposure control, adjustment of the mA and/or kV according to patient size and/or use of iterative reconstruction technique. COMPARISON:  None Available. FINDINGS: Brain: There is no mass, hemorrhage or extra-axial collection. The size and configuration of the ventricles and extra-axial CSF spaces are normal. The brain parenchyma is normal, without evidence of acute or chronic infarction. Vascular: No abnormal hyperdensity of the major intracranial arteries or dural venous sinuses. No intracranial atherosclerosis. Skull: The visualized skull base, calvarium and extracranial soft tissues are normal. Sinuses/Orbits: No fluid levels or advanced mucosal thickening of the visualized paranasal sinuses. No mastoid or middle ear effusion. The orbits are normal. ASPECTS [facility] Stroke Program Early CT Score) - Ganglionic level infarction (caudate, lentiform nuclei, internal capsule, insula, M1-M3 cortex): 7 - Supraganglionic infarction (M4-M6 cortex): 3 Total score (0-10 with 10 being normal): 10 IMPRESSION: 1. No acute intracranial abnormality. 2. ASPECTS is 10. These results were communicated to Dr. Lesleigh Noe at 11:06 pm on 02/05/2022 by text page via the [facility] messaging system. Electronically Signed   By: Ulyses Jarred M.D.   On: 02/05/2022 23:06    PHYSICAL EXAM Constitutional: Thin but well-developed and well-nourished. Cardiovascular: Normal rate and regular rhythm. Perfusing extremities well Respiratory: Effort normal, non-labored breathing   Neuro: Mental Status: Patient is awake, alert, oriented to person, place, month, year,  and situation. Patient is able to give a clear and coherent history. No signs of aphasia or neglect Cranial Nerves: II: Visual Fields are full. Pupils are equal, round, and reactive to light.   III,IV, VI: EOMI without ptosis or diploplia.   V: Facial sensation is symmetric to light touch VII: Facial movement is symmetric.  VIII: hearing is intact to voice X: Uvula elevates symmetrically XI: Shoulder shrug is symmetric. XII: tongue is midline without atrophy or fasciculations.  Motor: Tone is normal. Bulk is normal. 5/5 strength was present in all four extremities.  Sensory: Sensation is symmetric to light touch and temperature in the arms and legs. Plantars: Toes are downgoing bilaterally.  Cerebellar: FNF and HKS are intact bilaterally Gait:  Deferred until therapy works with her    ASSESSMENT/PLAN Ms. Ashley Gutierrez is a 71 y.o. female with history of hypertension, hyperlipidemia, medication nonadherence, limited medical follow-up, who presents with acute onset dizziness She reports she was eating dinner with some friends when she started feel lightheaded around 8:30 PM on 8/24.  She therefore got up from the table and was noted to be ataxic by her friends.  She had nausea and vomiting, does not think that this was triggered in any way by the food she was eating.  During evaluation in the ED she had waves of dizziness, nausea, and retching at rest. EMS did note some left beating nystagmus. Initial examination did not show any skew deviation and no nystagmus with Dix-Hallpike or HINTS exam.  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 ASPECTS 10.    CTA head & neck No LVO or high- grade stenosis  MRI- pending 2D Echo EF 65 to 70% LDL 109 HgbA1c 5.4 VTE prophylaxis - SCDs No antithrombotic prior to admission, now on No antithrombotic. Until 24 hours post  TNK Therapy recommendations:  Outpatient follow up Disposition:  pending  Hypertension Permissive hypertension (OK if <180/105) but gradually normalize in 5-7 days Long-term BP goal normotensive PRN Labetalol   Hyperlipidemia LDL 109, goal < 70 Add Rosuvastatin '20mg'$   Continue statin at discharge  Other Stroke Risk Factors Advanced Age >/= 72   Other Active Problems Dizziness and nausea Resolving  Vestibular PT consult placed Can consider meclizine and zofran for symptom management if they return  Hospital day # 1  Patient seen and examined by NP/APP with MD. MD to update note as needed.   Janine Ores, DNP, FNP-BC [facility] Pager: [phone]  ATTENDING NOTE: I reviewed above note and agree with the assessment and plan. Pt was seen and examined.   70 year old female with history of hypertension, hyperlipidemia admitted for dizziness, nausea vomiting, ataxia and nystagmus.  CT no acute abnormality, status post TNK.  CT head and neck no LVO, unremarkable.  MRI pending.  EF 65 to 70%.  LDL 109, A1c 5.4, UDS negative.  Creatinine 0.87  On exam, patient currently neurologically intact no neurodeficit EXTR slight right heel-to-shin dysmetria.  Etiology for patient symptoms concerning for small posterior circulation infarct.  Consider antiplatelet after 24 hours MRI.  Put on Crestor 20.  PT therapy recommend outpatient.  For detailed assessment and plan, please refer to above/below as I have made changes wherever appropriate.   Rosalin Hawking, MD PhD Stroke Neurology 02/06/2022 6:20 PM  This patient is critically ill due to stroke symptoms status post TNK and at significant risk of neurological worsening, death form recurrent stroke, hemorrhagic transformation, bleeding from TNK. This patient's care requires constant monitoring of vital signs, hemodynamics, respiratory and cardiac monitoring, review of multiple databases, neurological assessment, discussion with  family, other specialists and medical decision making of high complexity. I spent 35 minutes of neurocritical care time in the care of this patient. I had long discussion with patient and her son at bedside, updated pt current condition, treatment plan and potential prognosis, and answered all the questions.  They expressed understanding and appreciation.      To contact Stroke Continuity provider, please refer to [email or web address] After hours, contact General Neurology

## 2022-02-07 DIAGNOSIS — I639 Cerebral infarction, unspecified: Secondary | ICD-10-CM | POA: Diagnosis not present

## 2022-02-07 MED ORDER — ASPIRIN 81 MG PO TBEC: 81.0000 mg | DELAYED_RELEASE_TABLET | Freq: Every day | ORAL | 12 refills | Status: DC

## 2022-02-07 MED ORDER — ROSUVASTATIN CALCIUM 20 MG PO TABS: 20.0000 mg | ORAL_TABLET | Freq: Every day | ORAL | 1 refills | Status: DC

## 2022-02-07 MED ORDER — ASPIRIN 81 MG PO TBEC
81.0000 mg | DELAYED_RELEASE_TABLET | Freq: Every day | ORAL | Status: DC
  Administered 2022-02-07: 81 mg via ORAL
  Filled 2022-02-07: qty 1

## 2022-02-07 MED ORDER — CLOPIDOGREL BISULFATE 75 MG PO TABS: 75.0000 mg | ORAL_TABLET | Freq: Every day | ORAL | 0 refills | Status: DC

## 2022-02-07 MED ORDER — CLOPIDOGREL BISULFATE 75 MG PO TABS
75.0000 mg | ORAL_TABLET | Freq: Every day | ORAL | Status: DC
Start: 2022-02-07 — End: 2022-02-07
  Administered 2022-02-07: 75 mg via ORAL
  Filled 2022-02-07: qty 1

## 2022-02-07 NOTE — Discharge Summary (Addendum)
Stroke Discharge Summary  Patient ID: jaquaya coyle   MRN: [id number]      DOB: 09-29-1950  Date of Admission: 02/05/2022 Date of Discharge: 02/07/2022  Attending Physician:  Antony Contras MD Consultant(s):    None  Patient's PCP:  Pcp, No  DISCHARGE DIAGNOSIS:  Principal Problem:   Stroke determined by clinical assessment [facility]) Stroke like episode, symptoms resolved s/p TNK Dizziness Hyperlipidemia   Allergies as of 02/07/2022   No Known Allergies      Medication List     TAKE these medications    aspirin EC 81 MG tablet Take 1 tablet (81 mg total) by mouth daily. Swallow whole. Start taking on: February 08, 2022   clopidogrel 75 MG tablet Commonly known as: PLAVIX Take 1 tablet (75 mg total) by mouth daily. Start taking on: February 08, 2022   rosuvastatin 20 MG tablet Commonly known as: CRESTOR Take 1 tablet (20 mg total) by mouth daily. Start taking on: February 08, 2022        LABORATORY STUDIES CBC    Component Value Date/Time   WBC 9.0 02/05/2022 2253   RBC 4.64 02/05/2022 2253   HGB 15.3 (H) 02/05/2022 2254   HGB 14.6 03/28/2018 0835   HCT 45.0 02/05/2022 2254   HCT 43.6 03/28/2018 0835   PLT 255 02/05/2022 2253   PLT 274 03/28/2018 0835   MCV 91.4 02/05/2022 2253   MCV 89 03/28/2018 0835   MCH 30.8 02/05/2022 2253   MCHC 33.7 02/05/2022 2253   RDW 12.9 02/05/2022 2253   RDW 12.9 03/28/2018 0835   LYMPHSABS 1.1 02/05/2022 2253   LYMPHSABS 1.1 03/28/2018 0835   MONOABS 0.4 02/05/2022 2253   EOSABS 0.1 02/05/2022 2253   EOSABS 0.2 03/28/2018 0835   BASOSABS 0.0 02/05/2022 2253   BASOSABS 0.1 03/28/2018 0835   CMP    Component Value Date/Time   NA 140 02/06/2022 0215   NA 143 03/28/2018 0835   K 3.5 02/06/2022 0215   CL 106 02/06/2022 0215   CO2 26 02/06/2022 0215   GLUCOSE 164 (H) 02/06/2022 0215   BUN 9 02/06/2022 0215   BUN 10 03/28/2018 0835   CREATININE 0.87 02/06/2022 0215   CREATININE 0.70 04/29/2016 1117   CALCIUM 8.9  02/06/2022 0215   PROT 6.8 02/06/2022 0215   PROT 6.8 03/28/2018 0835   ALBUMIN 4.1 02/06/2022 0215   ALBUMIN 4.3 03/28/2018 0835   AST 19 02/06/2022 0215   ALT 19 02/06/2022 0215   ALKPHOS 79 02/06/2022 0215   BILITOT 0.5 02/06/2022 0215   BILITOT 0.6 03/28/2018 0835   GFRNONAA >60 02/06/2022 0215   GFRNONAA >89 04/29/2016 1117   GFRAA 96 03/28/2018 0835   GFRAA >89 04/29/2016 1117   COAGS Lab Results  Component Value Date   INR 1.1 02/05/2022   Lipid Panel    Component Value Date/Time   CHOL 193 02/06/2022 0215   CHOL 196 03/28/2018 0835   TRIG 29 02/06/2022 0215   HDL 78 02/06/2022 0215   HDL 67 03/28/2018 0835   CHOLHDL 2.5 02/06/2022 0215   VLDL 6 02/06/2022 0215   LDLCALC 109 (H) 02/06/2022 0215   LDLCALC 105 (H) 03/28/2018 0835   HgbA1C  Lab Results  Component Value Date   HGBA1C 5.4 02/05/2022   Urinalysis    Component Value Date/Time   COLORURINE STRAW (A) 02/06/2022 0356   APPEARANCEUR CLEAR 02/06/2022 0356   LABSPEC 1.021 02/06/2022 0356   PHURINE 7.0 02/06/2022  0356   GLUCOSEU NEGATIVE 02/06/2022 0356   HGBUR SMALL (A) 02/06/2022 0356   BILIRUBINUR NEGATIVE 02/06/2022 0356   KETONESUR 5 (A) 02/06/2022 0356   PROTEINUR NEGATIVE 02/06/2022 0356   NITRITE NEGATIVE 02/06/2022 0356   LEUKOCYTESUR NEGATIVE 02/06/2022 0356   Urine Drug Screen     Component Value Date/Time   LABOPIA NONE DETECTED 02/06/2022 0356   COCAINSCRNUR NONE DETECTED 02/06/2022 0356   LABBENZ NONE DETECTED 02/06/2022 0356   AMPHETMU NONE DETECTED 02/06/2022 0356   THCU NONE DETECTED 02/06/2022 0356   LABBARB NONE DETECTED 02/06/2022 0356    Alcohol Level    Component Value Date/Time   ETH <10 02/05/2022 2253     SIGNIFICANT DIAGNOSTIC STUDIES MR BRAIN WO CONTRAST  Result Date: 02/06/2022 CLINICAL DATA:  Stroke follow-up.  Status post T NK EXAM: MRI HEAD WITHOUT CONTRAST TECHNIQUE: Multiplanar, multiecho pulse sequences of the brain and surrounding structures were  obtained without intravenous contrast. COMPARISON:  None Available. FINDINGS: Brain: No acute infarct, mass effect or extra-axial collection. No acute or chronic hemorrhage. Minimal multifocal hyperintense T2-weight signal within the white matter. The midline structures are normal. Vascular: Major flow voids are preserved. Skull and upper cervical spine: Normal calvarium and skull base. Visualized upper cervical spine and soft tissues are normal. Sinuses/Orbits:No paranasal sinus fluid levels or advanced mucosal thickening. No mastoid or middle ear effusion. Normal orbits. IMPRESSION: Normal brain MRI for age.  No acute ischemia or hemorrhage. Electronically Signed   By: Ulyses Jarred M.D.   On: 02/06/2022 23:40   ECHOCARDIOGRAM COMPLETE  Result Date: 02/06/2022    ECHOCARDIOGRAM REPORT   Patient Name:   Ashley Gutierrez Neumeister Date of Exam: 02/06/2022 Medical Rec #:  [id number]     Height:       66.0 in Accession #:    [id number]    Weight:       124.8 lb Date of Birth:  Apr 26, 1951     BSA:          1.636 m Patient Age:    68 years      BP:           161/73 mmHg Patient Gender: F             HR:           68 bpm. Exam Location:  Inpatient Procedure: 2D Echo, Color Doppler and Cardiac Doppler Indications:    Stroke i63.9  History:        Patient has no prior history of Echocardiogram examinations.  Sonographer:    Raquel Sarna Senior RDCS Referring Phys: [id number] [facility]  1. Left ventricular ejection fraction, by estimation, is 65 to 70%. The left ventricle has normal function. The left ventricle has no regional wall motion abnormalities. Left ventricular diastolic parameters are consistent with Grade I diastolic dysfunction (impaired relaxation).  2. Right ventricular systolic function is normal. The right ventricular size is normal. There is normal pulmonary artery systolic pressure.  3. A small pericardial effusion is present. The pericardial effusion is posterior to the left ventricle.  4. The mitral valve  is normal in structure. No evidence of mitral valve regurgitation. No evidence of mitral stenosis.  5. The aortic valve is tricuspid. Aortic valve regurgitation is not visualized. No aortic stenosis is present.  6. The inferior vena cava is dilated in size with >50% respiratory variability, suggesting right atrial pressure of 8 mmHg.  7. Cannot exclude a small PFO. FINDINGS  Left Ventricle: Left  ventricular ejection fraction, by estimation, is 65 to 70%. The left ventricle has normal function. The left ventricle has no regional wall motion abnormalities. The left ventricular internal cavity size was normal in size. There is  no left ventricular hypertrophy. Left ventricular diastolic parameters are consistent with Grade I diastolic dysfunction (impaired relaxation). Normal left ventricular filling pressure. Right Ventricle: The right ventricular size is normal. No increase in right ventricular wall thickness. Right ventricular systolic function is normal. There is normal pulmonary artery systolic pressure. The tricuspid regurgitant velocity is 2.60 m/s, and  with an assumed right atrial pressure of 8 mmHg, the estimated right ventricular systolic pressure is 14.4 mmHg. Left Atrium: Left atrial size was normal in size. Right Atrium: Right atrial size was normal in size. Pericardium: A small pericardial effusion is present. The pericardial effusion is posterior to the left ventricle. Mitral Valve: The mitral valve is normal in structure. No evidence of mitral valve regurgitation. No evidence of mitral valve stenosis. Tricuspid Valve: The tricuspid valve is normal in structure. Tricuspid valve regurgitation is mild . No evidence of tricuspid stenosis. Aortic Valve: The aortic valve is tricuspid. Aortic valve regurgitation is not visualized. No aortic stenosis is present. Pulmonic Valve: The pulmonic valve was normal in structure. Pulmonic valve regurgitation is not visualized. No evidence of pulmonic stenosis. Aorta:  The aortic root is normal in size and structure. Venous: The inferior vena cava is dilated in size with greater than 50% respiratory variability, suggesting right atrial pressure of 8 mmHg. IAS/Shunts: Cannot exclude a small PFO.  LEFT VENTRICLE PLAX 2D LVIDd:         4.10 cm   Diastology LVIDs:         2.40 cm   LV e' medial:    6.74 cm/s LV PW:         0.90 cm   LV E/e' medial:  8.8 LV IVS:        0.80 cm   LV e' lateral:   6.20 cm/s LVOT diam:     1.90 cm   LV E/e' lateral: 9.6 LV SV:         61 LV SV Index:   37 LVOT Area:     2.84 cm  RIGHT VENTRICLE RV S prime:     17.30 cm/s TAPSE (M-mode): 2.4 cm LEFT ATRIUM             Index        RIGHT ATRIUM           Index LA diam:        2.20 cm 1.34 cm/m   RA Area:     13.40 cm LA Vol (A2C):   26.6 ml 16.25 ml/m  RA Volume:   30.30 ml  18.52 ml/m LA Vol (A4C):   23.0 ml 14.05 ml/m LA Biplane Vol: 24.4 ml 14.91 ml/m  AORTIC VALVE LVOT Vmax:   97.30 cm/s LVOT Vmean:  67.600 cm/s LVOT VTI:    0.216 m  AORTA Ao Root diam: 3.00 cm MITRAL VALVE               TRICUSPID VALVE MV Area (PHT): 3.70 cm    TR Peak grad:   27.0 mmHg MV Decel Time: 205 msec    TR Vmax:        260.00 cm/s MV E velocity: 59.60 cm/s MV A velocity: 74.60 cm/s  SHUNTS MV E/A ratio:  0.80        Systemic VTI:  0.22 m                            Systemic Diam: 1.90 cm Skeet Latch MD Electronically signed by Skeet Latch MD Signature Date/Time: 02/06/2022/12:48:27 PM    Final    CT ANGIO HEAD NECK W WO CM (CODE STROKE)  Result Date: 02/05/2022 CLINICAL DATA:  Nausea and vomiting.  Code stroke. EXAM: CT ANGIOGRAPHY HEAD AND NECK TECHNIQUE: Multidetector CT imaging of the head and neck was performed using the standard protocol during bolus administration of intravenous contrast. Multiplanar CT image reconstructions and MIPs were obtained to evaluate the vascular anatomy. Carotid stenosis measurements (when applicable) are obtained utilizing NASCET criteria, using the distal internal carotid  diameter as the denominator. RADIATION DOSE REDUCTION: This exam was performed according to the departmental dose-optimization program which includes automated exposure control, adjustment of the mA and/or kV according to patient size and/or use of iterative reconstruction technique. CONTRAST:  14m OMNIPAQUE IOHEXOL 350 MG/ML SOLN COMPARISON:  None Available. FINDINGS: CTA NECK FINDINGS SKELETON: There is no bony spinal canal stenosis. No lytic or blastic lesion. OTHER NECK: Normal pharynx, larynx and major salivary glands. No cervical lymphadenopathy. Unremarkable thyroid gland. UPPER CHEST: No pneumothorax or pleural effusion. No nodules or masses. AORTIC ARCH: There is no calcific atherosclerosis of the aortic arch. There is no aneurysm, dissection or hemodynamically significant stenosis of the visualized portion of the aorta. Normal variant aortic arch branching pattern with the left vertebral artery arising independently from the aortic arch. The visualized proximal subclavian arteries are widely patent. RIGHT CAROTID SYSTEM: Normal without aneurysm, dissection or stenosis. LEFT CAROTID SYSTEM: Normal without aneurysm, dissection or stenosis. VERTEBRAL ARTERIES: Left dominant configuration. Both origins are clearly patent. There is no dissection, occlusion or flow-limiting stenosis to the skull base (V1-V3 segments). CTA HEAD FINDINGS POSTERIOR CIRCULATION: --Vertebral arteries: Normal V4 segments. --Inferior cerebellar arteries: Normal. --Basilar artery: Normal. --Superior cerebellar arteries: Normal. --Posterior cerebral arteries (PCA): Normal. ANTERIOR CIRCULATION: --Intracranial internal carotid arteries: Normal. --Anterior cerebral arteries (ACA): Normal. Both A1 segments are present. Patent anterior communicating artery (a-comm). --Middle cerebral arteries (MCA): Normal. VENOUS SINUSES: As permitted by contrast timing, patent. ANATOMIC VARIANTS: None Review of the MIP images confirms the above findings.  IMPRESSION: No emergent large vessel occlusion or high-grade stenosis of the intracranial or cervical arteries. Electronically Signed   By: KUlyses JarredM.D.   On: 02/05/2022 23:30   CT HEAD CODE STROKE WO CONTRAST  Result Date: 02/05/2022 CLINICAL DATA:  Code stroke.  Nausea and vomiting EXAM: CT HEAD WITHOUT CONTRAST TECHNIQUE: Contiguous axial images were obtained from the base of the skull through the vertex without intravenous contrast. RADIATION DOSE REDUCTION: This exam was performed according to the departmental dose-optimization program which includes automated exposure control, adjustment of the mA and/or kV according to patient size and/or use of iterative reconstruction technique. COMPARISON:  None Available. FINDINGS: Brain: There is no mass, hemorrhage or extra-axial collection. The size and configuration of the ventricles and extra-axial CSF spaces are normal. The brain parenchyma is normal, without evidence of acute or chronic infarction. Vascular: No abnormal hyperdensity of the major intracranial arteries or dural venous sinuses. No intracranial atherosclerosis. Skull: The visualized skull base, calvarium and extracranial soft tissues are normal. Sinuses/Orbits: No fluid levels or advanced mucosal thickening of the visualized paranasal sinuses. No mastoid or middle ear effusion. The orbits are normal. ASPECTS ([facility] Early CT Score) - Ganglionic level infarction (  caudate, lentiform nuclei, internal capsule, insula, M1-M3 cortex): 7 - Supraganglionic infarction (M4-M6 cortex): 3 Total score (0-10 with 10 being normal): 10 IMPRESSION: 1. No acute intracranial abnormality. 2. ASPECTS is 10. These results were communicated to Dr. Lesleigh Noe at 11:06 pm on 02/05/2022 by text page via the [facility] messaging system. Electronically Signed   By: Ulyses Jarred M.D.   On: 02/05/2022 23:06      HISTORY OF PRESENT ILLNESS Ms. NOELA BROTHERS is a 71 y.o. female with history of  hypertension, hyperlipidemia, medication nonadherence, limited medical follow-up, who presents with acute onset dizziness She reports she was eating dinner with some friends when she started feel lightheaded around 8:30 PM on 8/24.  She therefore got up from the table and was noted to be ataxic by her friends.  She had nausea and vomiting, does not think that this was triggered in any way by the food she was eating.  During evaluation in the ED she had waves of dizziness, nausea, and retching at rest. EMS did note some left beating nystagmus. Initial examination did not show any skew deviation and no nystagmus with Dix-Hallpike or HINTS exam.     HOSPITAL COURSE Stroke like episode, symptoms resolved s/p TNK Code Stroke CT head No acute abnormality. ASPECTS 10.    CTA head & neck No LVO or high- grade stenosis  MRI- Negative for acute  2D Echo EF 65 to 70% LDL 109 HgbA1c 5.4 VTE prophylaxis - SCDs No antithrombotic prior to admission, now on ASA '81mg'$  and plavix '75mg'$  for 3 weeks and the ASA '81mg'$  alone Therapy recommendations:  Outpatient follow up Disposition:  pending   Hypertension Permissive hypertension (OK if <180/105) but gradually normalize in 5-7 days Long-term BP goal normotensive PRN Labetalol    Hyperlipidemia LDL 109, goal < 70 Add Rosuvastatin '20mg'$   Continue statin at discharge   Other Stroke Risk Factors Advanced Age >/= 52    Other Active Problems Dizziness and nausea Resolving  Vestibular PT consult placed Can consider meclizine and zofran for symptom management if they retur  RN Pressure Injury Documentation:     DISCHARGE EXAM Blood pressure (!) 159/85, pulse 82, temperature 98.2 F (36.8 C), temperature source Oral, resp. rate 19, height '5\' 6"'$  (1.676 m), weight 56.6 kg, SpO2 98 %.  PHYSICAL EXAM Constitutional: Thin but well-developed and well-nourished. Cardiovascular: Normal rate and regular rhythm. Perfusing extremities well Respiratory: Effort  normal, non-labored breathing   Neuro: Mental Status: Patient is awake, alert, oriented to person, place, month, year, and situation. Patient is able to give a clear and coherent history. No signs of aphasia or neglect Cranial Nerves: II: Visual Fields are full. Pupils are equal, round, and reactive to light.   III,IV, VI: EOMI without ptosis or diploplia.   V: Facial sensation is symmetric to light touch VII: Facial movement is symmetric.  VIII: hearing is intact to voice X: Uvula elevates symmetrically XI: Shoulder shrug is symmetric. XII: tongue is midline without atrophy or fasciculations.  Motor: Tone is normal. Bulk is normal. 5/5 strength was present in all four extremities.  Sensory: Sensation is symmetric to light touch and temperature in the arms and legs. Plantars: Toes are downgoing bilaterally.  Cerebellar: FNF and HKS are intact bilaterally Gait:  Steady  Discharge Diet       Diet   Diet heart healthy/carb modified Room service appropriate? Yes with Assist; Fluid consistency: Thin   liquids  DISCHARGE PLAN Disposition:  Home aspirin 81 mg  daily for secondary stroke prevention  Ongoing stroke risk factor control by Primary Care Physician at time of discharge Follow-up PCP Pcp, No in 2 weeks. Follow-up in [facility] in 8 weeks, office to schedule an appointment.   34 minutes were spent preparing discharge.  Patient seen and examined by NP/APP with MD. MD to update note as needed.   Janine Ores, DNP, FNP-BC [facility] Pager: [phone]  I have personally obtained history,examined this patient, reviewed notes, independently viewed imaging studies, participated in medical decision making and plan of care.ROS completed by me personally and pertinent positives fully documented  I have made any additions or clarifications directly to the above note. Agree with note above.  Patient presented with sudden onset of  dizziness and gait imbalance along with nausea with no other focal symptoms.  She was treated with IV TNK for presumed posterior circulation stroke but MRI did not show definite stroke and his symptoms resolved.  Recommend aspirin for stroke prevention and aggressive risk factor modification.  Follow-up as an outpatient 2 months in stroke clinic.  Antony Contras, MD Medical Director [facility] Pager: [phone] 02/07/2022 2:00 PM

## 2022-02-07 NOTE — Progress Notes (Signed)
Physical Therapy Treatment Patient Details Name: Ashley Gutierrez MRN: [id number] DOB: Jun 03, 1951 Today's Date: 02/07/2022   History of Present Illness 71 yo female presents to [facility] on 8/24 with acute onset dizziness and ataxia. TPA given 2310 on 8/24, CTH and MRI negative. PMH includes HTN, HLD.    PT Comments    Pt mod I bed mobility and transfers. Supervision provided for amb 200' without AD. Steady gait noted. Pt reports no dizziness but c/o mild headache. PT to continue to follow acutely. No follow up services indicated.     Recommendations for follow up therapy are one component of a multi-disciplinary discharge planning process, led by the attending physician.  Recommendations may be updated based on patient status, additional functional criteria and insurance authorization.  Follow Up Recommendations  No PT follow up     Assistance Recommended at Discharge PRN  Patient can return home with the following     Equipment Recommendations  None recommended by PT    Recommendations for Other Services       Precautions / Restrictions Precautions Precautions: Fall     Mobility  Bed Mobility Overal bed mobility: Modified Independent                  Transfers Overall transfer level: Modified independent Equipment used: None                    Ambulation/Gait Ambulation/Gait assistance: Supervision Gait Distance (Feet): 200 Feet Assistive device: None Gait Pattern/deviations: Step-through pattern, Decreased stride length, Narrow base of support Gait velocity: decreased     General Gait Details: steady gait   Stairs             Wheelchair Mobility    Modified Rankin (Stroke Patients Only) Modified Rankin (Stroke Patients Only) Pre-Morbid Rankin Score: No symptoms Modified Rankin: No significant disability     Balance Overall balance assessment: Mild deficits observed, not formally tested                                           Cognition Arousal/Alertness: Awake/alert Behavior During Therapy: WFL for tasks assessed/performed Overall Cognitive Status: Within Functional Limits for tasks assessed                                          Exercises      General Comments General comments (skin integrity, edema, etc.): VSS on RA      Pertinent Vitals/Pain Pain Assessment Pain Assessment: Faces Faces Pain Scale: Hurts a little bit Pain Location: headache Pain Descriptors / Indicators: Headache Pain Intervention(s): Limited activity within patient's tolerance, Premedicated before session    Home Living                          Prior Function            PT Goals (current goals can now be found in the care plan section) Progress towards PT goals: Progressing toward goals    Frequency    Min 4X/week      PT Plan Discharge plan needs to be updated    Co-evaluation              AM-PAC PT "6 Clicks" Mobility   Outcome  Measure  Help needed turning from your back to your side while in a flat bed without using bedrails?: None Help needed moving from lying on your back to sitting on the side of a flat bed without using bedrails?: None Help needed moving to and from a bed to a chair (including a wheelchair)?: None Help needed standing up from a chair using your arms (e.g., wheelchair or bedside chair)?: None Help needed to walk in hospital room?: A Little Help needed climbing 3-5 steps with a railing? : A Little 6 Click Score: 22    End of Session   Activity Tolerance: Patient tolerated treatment well Patient left: in bed;with call bell/phone within reach Nurse Communication: Mobility status PT Visit Diagnosis: Other abnormalities of gait and mobility (R26.89);Dizziness and giddiness (R42)     Time: 8242-3536 PT Time Calculation (min) (ACUTE ONLY): 14 min  Charges:  $Gait Training: 8-22 mins                     Lorrin Goodell, PT  Office #  [phone] Pager [phone]    Lorriane Shire 02/07/2022, 8:56 AM

## 2022-02-07 NOTE — Progress Notes (Signed)
AVS given to and gone over with patient. All information provided about discharge, stroke symptoms, activity and diet, medication pick-up, medication instructions with side effects and when to call doctor.  Instructed to schedule follow up w/ Dr. Leonie Man in 8 weeks.

## 2022-03-18 DIAGNOSIS — Z1331 Encounter for screening for depression: Secondary | ICD-10-CM | POA: Diagnosis not present

## 2022-03-18 DIAGNOSIS — H269 Unspecified cataract: Secondary | ICD-10-CM | POA: Diagnosis not present

## 2022-03-18 DIAGNOSIS — E785 Hyperlipidemia, unspecified: Secondary | ICD-10-CM | POA: Diagnosis not present

## 2022-03-18 DIAGNOSIS — Z8673 Personal history of transient ischemic attack (TIA), and cerebral infarction without residual deficits: Secondary | ICD-10-CM | POA: Diagnosis not present

## 2022-03-18 DIAGNOSIS — Z1339 Encounter for screening examination for other mental health and behavioral disorders: Secondary | ICD-10-CM | POA: Diagnosis not present

## 2022-03-18 DIAGNOSIS — R030 Elevated blood-pressure reading, without diagnosis of hypertension: Secondary | ICD-10-CM | POA: Diagnosis not present

## 2022-03-18 DIAGNOSIS — R2689 Other abnormalities of gait and mobility: Secondary | ICD-10-CM | POA: Diagnosis not present

## 2022-04-15 ENCOUNTER — Encounter: Payer: Self-pay | Admitting: Neurology

## 2022-04-15 ENCOUNTER — Ambulatory Visit (INDEPENDENT_AMBULATORY_CARE_PROVIDER_SITE_OTHER): Payer: Medicare PPO | Admitting: Neurology

## 2022-04-15 VITALS — BP 178/83 | HR 76 | Ht 67.0 in | Wt 125.0 lb

## 2022-04-15 DIAGNOSIS — G459 Transient cerebral ischemic attack, unspecified: Secondary | ICD-10-CM | POA: Diagnosis not present

## 2022-04-15 DIAGNOSIS — R2689 Other abnormalities of gait and mobility: Secondary | ICD-10-CM

## 2022-04-15 NOTE — Progress Notes (Signed)
[facility] [address] [place]. [place] [address] [phone]       OFFICE FOLLOW-UP NOTE  Ashley Gutierrez Date of Birth:  [birth year] Medical Record Number:  [id number]   HPI: Ashley Gutierrez is a pleasant 71 year old Caucasian lady seen today for initial office follow-up visit following hospital admission for suspected stroke August 2023.  I personally obtained history from the patient and reviewed electronic medical record and imaging films in PACS.Ashley Gutierrez is a 71 y.o. female with history of hypertension, hyperlipidemia, medication nonadherence, limited medical follow-up, who presented on 02/01/2022 with acute onset dizziness She reported she was eating dinner with some friends when she started feel lightheaded around 8:30 PM on 8/24.  She therefore got up from the table and was noted to be ataxic by her friends.  She had nausea and vomiting, does not think that this was triggered in any way by the food she was eating.  During evaluation in the ED she had waves of dizziness, nausea, and retching at rest. EMS did note some left beating nystagmus. Initial examination did not show any skew deviation and no nystagmus with Dix-Hallpike or HINTS exam.  Patient showed quick resolution of her symptoms after TNK administration.  MRI scan showed no acute infarct.  Echocardiogram showed ejection fraction of 65 to 70%.  LDL cholesterol was elevated at 109 mg percent and hemoglobin A1c was 5.4.  Patient was started on aspirin and Plavix for 3 weeks followed by aspirin alone.  She states she is doing well.  She has had no recurrent or new symptoms.  She is tolerating aspirin well without significant bruising or bleeding.  Her blood pressure is quite well controlled at home in the 546T systolic range usually but today it is elevated in office at 178 83.  He is tolerating Crestor but primary care physician has reduced her dose recently to 10 mg daily.  She plans on having follow-up lipid  profile checked in a few weeks.  She has no new complaints today.  She denies any prior history of strokes TIA     ROS:   14 system review of systems is positive for imbalance, dizziness, nausea, vomiting, lightheadedness and all other systems negative  PMH:  Past Medical History:  Diagnosis Date   Pneumonia     Social History:  Social History   Socioeconomic History   Marital status: Married    Spouse name: Not on file   Number of children: 1   Years of education: Not on file   Highest education level: Not on file  Occupational History   Occupation: Retired    Comment: Science writer  Tobacco Use   Smoking status: Never   Smokeless tobacco: Never  Vaping Use   Vaping Use: Never used  Substance and Sexual Activity   Alcohol use: No   Drug use: No   Sexual activity: Not on file  Other Topics Concern   Not on file  Social History Narrative   Not on file   Social Determinants of Health   Financial Resource Strain: Not on file  Food Insecurity: Not on file  Transportation Needs: Not on file  Physical Activity: Not on file  Stress: Not on file  Social Connections: Not on file  Intimate Partner Violence: Not on file    Medications:   Current Outpatient Medications on File Prior to Visit  Medication Sig Dispense Refill   aspirin EC 81 MG tablet Take 1 tablet (81 mg total)  by mouth daily. Swallow whole. 30 tablet 12   rosuvastatin (CRESTOR) 20 MG tablet Take 1 tablet (20 mg total) by mouth daily. 30 tablet 1   No current facility-administered medications on file prior to visit.    Allergies:  No Known Allergies  Physical Exam General: well developed, well nourished pleasant elderly Caucasian lady, seated, in no evident distress Head: head normocephalic and atraumatic.  Neck: supple with no carotid or supraclavicular bruits Cardiovascular: regular rate and rhythm, no murmurs Musculoskeletal: no deformity Skin:  no rash/petichiae Vascular:  Normal pulses all  extremities Vitals:   04/15/22 1035  BP: (!) 178/83  Pulse: 76   Neurologic Exam Mental Status: Awake and fully alert. Oriented to place and time. Recent and remote memory intact. Attention span, concentration and fund of knowledge appropriate. Mood and affect appropriate.  Cranial Nerves: Fundoscopic exam reveals sharp disc margins. Pupils equal, briskly reactive to light. Extraocular movements full without nystagmus. Visual fields full to confrontation. Hearing intact. Facial sensation intact. Face, tongue, palate moves normally and symmetrically.  Motor: Normal bulk and tone. Normal strength in all tested extremity muscles. Sensory.: intact to touch ,pinprick .position and vibratory sensation.  Coordination: Rapid alternating movements normal in all extremities. Finger-to-nose and heel-to-shin performed accurately bilaterally. Gait and Station: Arises from chair without difficulty. Stance is normal. Gait demonstrates normal stride length and balance . Able to heel, toe and tandem walk without difficulty.  Reflexes: 1+ and symmetric. Toes downgoing.   NIHSS  0 Modified Rankin  0   ASSESSMENT: 71 year old Caucasian lady with constant episode of sudden onset gait imbalance and nausea likely from posterior circulation ischemia treated with IV TNK.  With resolution of symptoms but MRI not showing definite acute stroke.  Vascular risk factors of hyperlipidemia only.    PLAN: I had a long d/w patient about her recent TIA, risk for recurrent stroke/TIAs, personally independently reviewed imaging studies and stroke evaluation results and answered questions.Continue aspirin 81 mg daily  for secondary stroke prevention and maintain strict control of hypertension with blood pressure goal below 130/90, diabetes with hemoglobin A1c goal below 6.5% and lipids with LDL cholesterol goal below 70 mg/dL. I also advised the patient to eat a healthy diet with plenty of whole grains, cereals, fruits and  vegetables, exercise regularly and maintain ideal body weight Followup in the future with my NP in 6 months or call earlier if needed. Greater than 50% of time during this 35 minute visit was spent on counseling,explanation of diagnosis of TIA and stroke planning of further management, discussion with patient and family and coordination of care Antony Contras, MD Note: This document was prepared with digital dictation and possible smart phrase technology. Any transcriptional errors that result from this process are unintentional

## 2022-04-15 NOTE — Patient Instructions (Signed)
I had a long d/w patient about her recent TIA, risk for recurrent stroke/TIAs, personally independently reviewed imaging studies and stroke evaluation results and answered questions.Continue aspirin 81 mg daily  for secondary stroke prevention and maintain strict control of hypertension with blood pressure goal below 130/90, diabetes with hemoglobin A1c goal below 6.5% and lipids with LDL cholesterol goal below 70 mg/dL. I also advised the patient to eat a healthy diet with plenty of whole grains, cereals, fruits and vegetables, exercise regularly and maintain ideal body weight Followup in the future with my NP in 6 months or call earlier if needed.   Stroke Prevention Some medical conditions and behaviors can lead to a higher chance of having a stroke. You can help prevent a stroke by eating healthy, exercising, not smoking, and managing any medical conditions you have. Stroke is a leading cause of functional impairment. Primary prevention is particularly important because a majority of strokes are first-time events. Stroke changes the lives of not only those who experience a stroke but also their family and other caregivers. How can this condition affect me? A stroke is a medical emergency and should be treated right away. A stroke can lead to brain damage and can sometimes be life-threatening. If a person gets medical treatment right away, there is a better chance of surviving and recovering from a stroke. What can increase my risk? The following medical conditions may increase your risk of a stroke: Cardiovascular disease. High blood pressure (hypertension). Diabetes. High cholesterol. Sickle cell disease. Blood clotting disorders (hypercoagulable state). Obesity. Sleep disorders (obstructive sleep apnea). Other risk factors include: Being older than age 32. Having a history of blood clots, stroke, or mini-stroke (transient ischemic attack, TIA). Genetic factors, such as race, ethnicity, or a  family history of stroke. Smoking cigarettes or using other tobacco products. Taking birth control pills, especially if you also use tobacco. Heavy use of alcohol or drugs, especially cocaine and methamphetamine. Physical inactivity. What actions can I take to prevent this? Manage your health conditions High cholesterol levels. Eating a healthy diet is important for preventing high cholesterol. If cholesterol cannot be managed through diet alone, you may need to take medicines. Take any prescribed medicines to control your cholesterol as told by your health care provider. Hypertension. To reduce your risk of stroke, try to keep your blood pressure below 130/80. Eating a healthy diet and exercising regularly are important for controlling blood pressure. If these steps are not enough to manage your blood pressure, you may need to take medicines. Take any prescribed medicines to control hypertension as told by your health care provider. Ask your health care provider if you should monitor your blood pressure at home. Have your blood pressure checked every year, even if your blood pressure is normal. Blood pressure increases with age and some medical conditions. Diabetes. Eating a healthy diet and exercising regularly are important parts of managing your blood sugar (glucose). If your blood sugar cannot be managed through diet and exercise, you may need to take medicines. Take any prescribed medicines to control your diabetes as told by your health care provider. Get evaluated for obstructive sleep apnea. Talk to your health care provider about getting a sleep evaluation if you snore a lot or have excessive sleepiness. Make sure that any other medical conditions you have, such as atrial fibrillation or atherosclerosis, are managed. Nutrition Follow instructions from your health care provider about what to eat or drink to help manage your health condition. These instructions may  include: Reducing  your daily calorie intake. Limiting how much salt (sodium) you use to 1,500 milligrams (mg) each day. Using only healthy fats for cooking, such as olive oil, canola oil, or sunflower oil. Eating healthy foods. You can do this by: Choosing foods that are high in fiber, such as whole grains, and fresh fruits and vegetables. Eating at least 5 servings of fruits and vegetables a day. Try to fill one-half of your plate with fruits and vegetables at each meal. Choosing lean protein foods, such as lean cuts of meat, poultry without skin, fish, tofu, beans, and nuts. Eating low-fat dairy products. Avoiding foods that are high in sodium. This can help lower blood pressure. Avoiding foods that have saturated fat, trans fat, and cholesterol. This can help prevent high cholesterol. Avoiding processed and prepared foods. Counting your daily carbohydrate intake.  Lifestyle If you drink alcohol: Limit how much you have to: 0-1 drink a day for women who are not pregnant. 0-2 drinks a day for men. Know how much alcohol is in your drink. In the [place] one drink equals one 12 oz bottle of beer (352m), one 5 oz glass of wine (1476m, or one 1 oz glass of hard liquor (4421m Do not use any products that contain nicotine or tobacco. These products include cigarettes, chewing tobacco, and vaping devices, such as e-cigarettes. If you need help quitting, ask your health care provider. Avoid secondhand smoke. Do not use drugs. Activity  Try to stay at a healthy weight. Get at least 30 minutes of exercise on most days, such as: Fast walking. Biking. Swimming. Medicines Take over-the-counter and prescription medicines only as told by your health care provider. Aspirin or blood thinners (antiplatelets or anticoagulants) may be recommended to reduce your risk of forming blood clots that can lead to stroke. Avoid taking birth control pills. Talk to your health care provider about the risks of taking birth control  pills if: You are over 35 59ars old. You smoke. You get very bad headaches. You have had a blood clot. Where to find more information American Stroke Association: www.strokeassociation.org Get help right away if: You or a loved one has any symptoms of a stroke. "BE FAST" is an easy way to remember the main warning signs of a stroke: B - Balance. Signs are dizziness, sudden trouble walking, or loss of balance. E - Eyes. Signs are trouble seeing or a sudden change in vision. F - Face. Signs are sudden weakness or numbness of the face, or the face or eyelid drooping on one side. A - Arms. Signs are weakness or numbness in an arm. This happens suddenly and usually on one side of the body. S - Speech. Signs are sudden trouble speaking, slurred speech, or trouble understanding what people say. T - Time. Time to call emergency services. Write down what time symptoms started. You or a loved one has other signs of a stroke, such as: A sudden, severe headache with no known cause. Nausea or vomiting. Seizure. These symptoms may represent a serious problem that is an emergency. Do not wait to see if the symptoms will go away. Get medical help right away. Call your local emergency services (911 in the [place] Do not drive yourself to the hospital. Summary You can help to prevent a stroke by eating healthy, exercising, not smoking, limiting alcohol intake, and managing any medical conditions you may have. Do not use any products that contain nicotine or tobacco. These include cigarettes, chewing tobacco, and vaping  devices, such as e-cigarettes. If you need help quitting, ask your health care provider. Remember "BE FAST" for warning signs of a stroke. Get help right away if you or a loved one has any of these signs. This information is not intended to replace advice given to you by your health care provider. Make sure you discuss any questions you have with your health care provider. Document Revised:  01/01/2020 Document Reviewed: 01/01/2020 Elsevier Patient Education  [place].

## 2022-04-20 DIAGNOSIS — R7989 Other specified abnormal findings of blood chemistry: Secondary | ICD-10-CM | POA: Diagnosis not present

## 2022-04-20 DIAGNOSIS — E785 Hyperlipidemia, unspecified: Secondary | ICD-10-CM | POA: Diagnosis not present

## 2022-04-28 DIAGNOSIS — Z1331 Encounter for screening for depression: Secondary | ICD-10-CM | POA: Diagnosis not present

## 2022-04-28 DIAGNOSIS — Z0000 Encounter for general adult medical examination without abnormal findings: Secondary | ICD-10-CM | POA: Diagnosis not present

## 2022-04-28 DIAGNOSIS — R030 Elevated blood-pressure reading, without diagnosis of hypertension: Secondary | ICD-10-CM | POA: Diagnosis not present

## 2022-04-28 DIAGNOSIS — E785 Hyperlipidemia, unspecified: Secondary | ICD-10-CM | POA: Diagnosis not present

## 2022-04-28 DIAGNOSIS — Z8673 Personal history of transient ischemic attack (TIA), and cerebral infarction without residual deficits: Secondary | ICD-10-CM | POA: Diagnosis not present

## 2022-04-28 DIAGNOSIS — Z1339 Encounter for screening examination for other mental health and behavioral disorders: Secondary | ICD-10-CM | POA: Diagnosis not present

## 2022-05-18 ENCOUNTER — Other Ambulatory Visit: Payer: Self-pay

## 2022-05-18 NOTE — Patient Outreach (Signed)
Telephone outreach to patient to obtain mRS was successfully completed. MRS= 0  Ashley Gutierrez [facility] Care Management Assistant [phone]  

## 2022-10-21 NOTE — Progress Notes (Unsigned)
No chief complaint on file.   HISTORY OF PRESENT ILLNESS:  10/21/22 ALL:  Ashley Gutierrez is a 72 y.o. female here today for follow up for  history TIA. She was seen in consult with Dr Pearlean Brownie 04/2022.   She continues rosuvastatin and asa daily.    HISTORY (copied from Dr Marlis Edelson previous note)  HPI: Ashley Gutierrez is a pleasant 72 year old Caucasian lady seen today for initial office follow-up visit following hospital admission for suspected stroke August 2023.  I personally obtained history from the patient and reviewed electronic medical record and imaging films in PACS.Ashley Gutierrez is a 72 y.o. female with history of hypertension, hyperlipidemia, medication nonadherence, limited medical follow-up, who presented on 02/01/2022 with acute onset dizziness She reported she was eating dinner with some friends when she started feel lightheaded around 8:30 PM on 8/24.  She therefore got up from the table and was noted to be ataxic by her friends.  She had nausea and vomiting, does not think that this was triggered in any way by the food she was eating.  During evaluation in the ED she had waves of dizziness, nausea, and retching at rest. EMS did note some left beating nystagmus. Initial examination did not show any skew deviation and no nystagmus with Dix-Hallpike or HINTS exam.  Patient showed quick resolution of her symptoms after TNK administration.  MRI scan showed no acute infarct.  Echocardiogram showed ejection fraction of 65 to 70%.  LDL cholesterol was elevated at 109 mg percent and hemoglobin A1c was 5.4.  Patient was started on aspirin and Plavix for 3 weeks followed by aspirin alone.  She states she is doing well.  She has had no recurrent or new symptoms.  She is tolerating aspirin well without significant bruising or bleeding.  Her blood pressure is quite well controlled at home in the 130s systolic range usually but today it is elevated in office at 178 83.  He is tolerating Crestor but  primary care physician has reduced her dose recently to 10 mg daily.  She plans on having follow-up lipid profile checked in a few weeks.  She has no new complaints today.  She denies any prior history of strokes TIA   REVIEW OF SYSTEMS: Out of a complete 14 system review of symptoms, the patient complains only of the following symptoms, and all other reviewed systems are negative.   ALLERGIES: No Known Allergies   HOME MEDICATIONS: Outpatient Medications Prior to Visit  Medication Sig Dispense Refill   aspirin EC 81 MG tablet Take 1 tablet (81 mg total) by mouth daily. Swallow whole. 30 tablet 12   rosuvastatin (CRESTOR) 20 MG tablet Take 1 tablet (20 mg total) by mouth daily. 30 tablet 1   No facility-administered medications prior to visit.     PAST MEDICAL HISTORY: Past Medical History:  Diagnosis Date   Pneumonia      PAST SURGICAL HISTORY: No past surgical history on file.   FAMILY HISTORY: Family History  Problem Relation Age of Onset   Diabetes Mother    Hypertension Mother    Hyperlipidemia Mother      SOCIAL HISTORY: Social History   Socioeconomic History   Marital status: Married    Spouse name: Not on file   Number of children: 1   Years of education: Not on file   Highest education level: Not on file  Occupational History   Occupation: Retired    Comment: Photographer  Tobacco Use  Smoking status: Never   Smokeless tobacco: Never  Vaping Use   Vaping Use: Never used  Substance and Sexual Activity   Alcohol use: No   Drug use: No   Sexual activity: Not on file  Other Topics Concern   Not on file  Social History Narrative   Not on file   Social Determinants of Health   Financial Resource Strain: Not on file  Food Insecurity: Not on file  Transportation Needs: Not on file  Physical Activity: Not on file  Stress: Not on file  Social Connections: Not on file  Intimate Partner Violence: Not on file     PHYSICAL EXAM  There were no  vitals filed for this visit. There is no height or weight on file to calculate BMI.  Generalized: Well developed, in no acute distress  Cardiology: normal rate and rhythm, no murmur auscultated  Respiratory: clear to auscultation bilaterally    Neurological examination  Mentation: Alert oriented to time, place, history taking. Follows all commands speech and language fluent Cranial nerve II-XII: Pupils were equal round reactive to light. Extraocular movements were full, visual field were full on confrontational test. Facial sensation and strength were normal. Uvula tongue midline. Head turning and shoulder shrug  were normal and symmetric. Motor: The motor testing reveals 5 over 5 strength of all 4 extremities. Good symmetric motor tone is noted throughout.  Sensory: Sensory testing is intact to soft touch on all 4 extremities. No evidence of extinction is noted.  Coordination: Cerebellar testing reveals good finger-nose-finger and heel-to-shin bilaterally.  Gait and station: Gait is normal. Tandem gait is normal. Romberg is negative. No drift is seen.  Reflexes: Deep tendon reflexes are symmetric and normal bilaterally.    DIAGNOSTIC DATA (LABS, IMAGING, TESTING) - I reviewed patient records, labs, notes, testing and imaging myself where available.  Lab Results  Component Value Date   WBC 9.0 02/05/2022   HGB 15.3 (H) 02/05/2022   HCT 45.0 02/05/2022   MCV 91.4 02/05/2022   PLT 255 02/05/2022      Component Value Date/Time   NA 140 02/06/2022 0215   NA 143 03/28/2018 0835   K 3.5 02/06/2022 0215   CL 106 02/06/2022 0215   CO2 26 02/06/2022 0215   GLUCOSE 164 (H) 02/06/2022 0215   BUN 9 02/06/2022 0215   BUN 10 03/28/2018 0835   CREATININE 0.87 02/06/2022 0215   CREATININE 0.70 04/29/2016 1117   CALCIUM 8.9 02/06/2022 0215   PROT 6.8 02/06/2022 0215   PROT 6.8 03/28/2018 0835   ALBUMIN 4.1 02/06/2022 0215   ALBUMIN 4.3 03/28/2018 0835   AST 19 02/06/2022 0215   ALT 19  02/06/2022 0215   ALKPHOS 79 02/06/2022 0215   BILITOT 0.5 02/06/2022 0215   BILITOT 0.6 03/28/2018 0835   GFRNONAA >60 02/06/2022 0215   GFRNONAA >89 04/29/2016 1117   GFRAA 96 03/28/2018 0835   GFRAA >89 04/29/2016 1117   Lab Results  Component Value Date   CHOL 193 02/06/2022   HDL 78 02/06/2022   LDLCALC 109 (H) 02/06/2022   TRIG 29 02/06/2022   CHOLHDL 2.5 02/06/2022   Lab Results  Component Value Date   HGBA1C 5.4 02/05/2022   Lab Results  Component Value Date   VITAMINB12 725 04/29/2016   Lab Results  Component Value Date   TSH 2.590 03/28/2018        No data to display  No data to display           ASSESSMENT AND PLAN  72 y.o. year old female  has a past medical history of Pneumonia. here with    No diagnosis found.  Janean Sark ***.  Healthy lifestyle habits encouraged. *** will follow up with PCP as directed. *** will return to see me in ***, sooner if needed. *** verbalizes understanding and agreement with this plan.   No orders of the defined types were placed in this encounter.    No orders of the defined types were placed in this encounter.    Shawnie Dapper, MSN, FNP-C 10/21/2022, 4:25 PM  [facility] [address] [place], [place] [address] [phone]

## 2022-10-21 NOTE — Patient Instructions (Signed)
Below is our plan:  We will continue to monitor. Please keep a close eye on your blood pressure. I recommend keeping BP lower than 130/90. Please monitor your sodium intake. Continue aspirin and rosuvastatin. Goal LDL is less than 70.   Please make sure you are staying well hydrated. I recommend 50-60 ounces daily. Well balanced diet and regular exercise encouraged. Consistent sleep schedule with 6-8 hours recommended.   Please continue follow up with care team as directed.   Follow up with me as needed  You may receive a survey regarding today's visit. I encourage you to leave honest feed back as I do use this information to improve patient care. Thank you for seeing me today!

## 2022-10-22 ENCOUNTER — Ambulatory Visit (INDEPENDENT_AMBULATORY_CARE_PROVIDER_SITE_OTHER): Payer: Medicare PPO | Admitting: Family Medicine

## 2022-10-22 ENCOUNTER — Encounter: Payer: Self-pay | Admitting: Family Medicine

## 2022-10-22 VITALS — BP 165/90 | HR 74 | Ht 67.0 in | Wt 120.0 lb

## 2022-10-22 DIAGNOSIS — G459 Transient cerebral ischemic attack, unspecified: Secondary | ICD-10-CM

## 2022-10-23 ENCOUNTER — Encounter: Payer: Self-pay | Admitting: Family Medicine

## 2023-04-27 DIAGNOSIS — E785 Hyperlipidemia, unspecified: Secondary | ICD-10-CM | POA: Diagnosis not present

## 2023-04-27 DIAGNOSIS — R7989 Other specified abnormal findings of blood chemistry: Secondary | ICD-10-CM | POA: Diagnosis not present

## 2023-05-07 DIAGNOSIS — H269 Unspecified cataract: Secondary | ICD-10-CM | POA: Diagnosis not present

## 2023-05-07 DIAGNOSIS — Z0000 Encounter for general adult medical examination without abnormal findings: Secondary | ICD-10-CM | POA: Diagnosis not present

## 2023-05-07 DIAGNOSIS — R030 Elevated blood-pressure reading, without diagnosis of hypertension: Secondary | ICD-10-CM | POA: Diagnosis not present

## 2023-05-07 DIAGNOSIS — Z8673 Personal history of transient ischemic attack (TIA), and cerebral infarction without residual deficits: Secondary | ICD-10-CM | POA: Diagnosis not present

## 2023-05-07 DIAGNOSIS — M419 Scoliosis, unspecified: Secondary | ICD-10-CM | POA: Diagnosis not present

## 2023-05-07 DIAGNOSIS — E785 Hyperlipidemia, unspecified: Secondary | ICD-10-CM | POA: Diagnosis not present

## 2023-05-07 DIAGNOSIS — M25521 Pain in right elbow: Secondary | ICD-10-CM | POA: Diagnosis not present

## 2023-05-07 DIAGNOSIS — Z23 Encounter for immunization: Secondary | ICD-10-CM | POA: Diagnosis not present

## 2023-05-07 DIAGNOSIS — Z1339 Encounter for screening examination for other mental health and behavioral disorders: Secondary | ICD-10-CM | POA: Diagnosis not present

## 2023-05-07 DIAGNOSIS — B351 Tinea unguium: Secondary | ICD-10-CM | POA: Diagnosis not present

## 2023-05-07 DIAGNOSIS — Z1331 Encounter for screening for depression: Secondary | ICD-10-CM | POA: Diagnosis not present

## 2023-05-25 DIAGNOSIS — M419 Scoliosis, unspecified: Secondary | ICD-10-CM | POA: Diagnosis not present

## 2023-05-27 DIAGNOSIS — M419 Scoliosis, unspecified: Secondary | ICD-10-CM | POA: Diagnosis not present

## 2023-06-01 DIAGNOSIS — M419 Scoliosis, unspecified: Secondary | ICD-10-CM | POA: Diagnosis not present

## 2023-06-03 DIAGNOSIS — M419 Scoliosis, unspecified: Secondary | ICD-10-CM | POA: Diagnosis not present

## 2023-12-08 ENCOUNTER — Other Ambulatory Visit (HOSPITAL_COMMUNITY): Payer: Self-pay

## 2024-05-18 DIAGNOSIS — E785 Hyperlipidemia, unspecified: Secondary | ICD-10-CM | POA: Diagnosis not present

## 2024-05-18 DIAGNOSIS — Z780 Asymptomatic menopausal state: Secondary | ICD-10-CM | POA: Diagnosis not present

## 2024-06-17 ENCOUNTER — Inpatient Hospital Stay (HOSPITAL_COMMUNITY)
Admission: EM | Admit: 2024-06-17 | Discharge: 2024-06-21 | DRG: 522 | Disposition: A | Discharge status: Skilled Nursing Facility | Attending: Internal Medicine | Admitting: Internal Medicine

## 2024-06-17 ENCOUNTER — Emergency Department (HOSPITAL_COMMUNITY)

## 2024-06-17 ENCOUNTER — Other Ambulatory Visit: Payer: Self-pay

## 2024-06-17 DIAGNOSIS — S72042A Displaced fracture of base of neck of left femur, initial encounter for closed fracture: Secondary | ICD-10-CM | POA: Diagnosis not present

## 2024-06-17 DIAGNOSIS — Y92009 Unspecified place in unspecified non-institutional (private) residence as the place of occurrence of the external cause: Secondary | ICD-10-CM | POA: Diagnosis not present

## 2024-06-17 DIAGNOSIS — E785 Hyperlipidemia, unspecified: Secondary | ICD-10-CM | POA: Diagnosis present

## 2024-06-17 DIAGNOSIS — R030 Elevated blood-pressure reading, without diagnosis of hypertension: Secondary | ICD-10-CM | POA: Diagnosis present

## 2024-06-17 DIAGNOSIS — D62 Acute posthemorrhagic anemia: Secondary | ICD-10-CM | POA: Diagnosis not present

## 2024-06-17 DIAGNOSIS — S7290XA Unspecified fracture of unspecified femur, initial encounter for closed fracture: Secondary | ICD-10-CM | POA: Diagnosis present

## 2024-06-17 DIAGNOSIS — W010XXA Fall on same level from slipping, tripping and stumbling without subsequent striking against object, initial encounter: Secondary | ICD-10-CM | POA: Diagnosis present

## 2024-06-17 DIAGNOSIS — Z993 Dependence on wheelchair: Secondary | ICD-10-CM | POA: Diagnosis not present

## 2024-06-17 DIAGNOSIS — Z833 Family history of diabetes mellitus: Secondary | ICD-10-CM

## 2024-06-17 DIAGNOSIS — M810 Age-related osteoporosis without current pathological fracture: Secondary | ICD-10-CM | POA: Diagnosis present

## 2024-06-17 DIAGNOSIS — S72002A Fracture of unspecified part of neck of left femur, initial encounter for closed fracture: Principal | ICD-10-CM | POA: Diagnosis present

## 2024-06-17 DIAGNOSIS — E559 Vitamin D deficiency, unspecified: Secondary | ICD-10-CM | POA: Diagnosis present

## 2024-06-17 DIAGNOSIS — Z8249 Family history of ischemic heart disease and other diseases of the circulatory system: Secondary | ICD-10-CM | POA: Diagnosis not present

## 2024-06-17 DIAGNOSIS — Z79899 Other long term (current) drug therapy: Secondary | ICD-10-CM | POA: Diagnosis not present

## 2024-06-17 DIAGNOSIS — Z7982 Long term (current) use of aspirin: Secondary | ICD-10-CM | POA: Diagnosis not present

## 2024-06-17 DIAGNOSIS — I10 Essential (primary) hypertension: Secondary | ICD-10-CM | POA: Diagnosis not present

## 2024-06-17 LAB — CBC
HCT: 42.4 % (ref 36.0–46.0)
Hemoglobin: 13.9 g/dL (ref 12.0–15.0)
MCH: 30.1 pg (ref 26.0–34.0)
MCHC: 32.8 g/dL (ref 30.0–36.0)
MCV: 91.8 fL (ref 80.0–100.0)
Platelets: 246 K/uL (ref 150–400)
RBC: 4.62 MIL/uL (ref 3.87–5.11)
RDW: 13 % (ref 11.5–15.5)
WBC: 10.3 K/uL (ref 4.0–10.5)
nRBC: 0 % (ref 0.0–0.2)

## 2024-06-17 LAB — BASIC METABOLIC PANEL WITH GFR
Anion gap: 13 (ref 5–15)
BUN: 13 mg/dL (ref 8–23)
CO2: 25 mmol/L (ref 22–32)
Calcium: 9.2 mg/dL (ref 8.9–10.3)
Chloride: 104 mmol/L (ref 98–111)
Creatinine, Ser: 0.7 mg/dL (ref 0.44–1.00)
GFR, Estimated: >60 mL/min
Glucose, Bld: 132 mg/dL — ABNORMAL HIGH (ref 70–99)
Potassium: 3.6 mmol/L (ref 3.5–5.1)
Sodium: 142 mmol/L (ref 135–145)

## 2024-06-17 MED ORDER — ONDANSETRON HCL 4 MG/2ML IJ SOLN
4.0000 mg | Freq: Once | INTRAMUSCULAR | Status: DC
  Filled 2024-06-17: qty 2

## 2024-06-17 MED ORDER — HYDROMORPHONE HCL 1 MG/ML IJ SOLN
0.5000 mg | INTRAMUSCULAR | Status: DC | PRN
  Administered 2024-06-19: 1 mg via INTRAVENOUS
  Filled 2024-06-17 (×2): qty 1

## 2024-06-17 MED ORDER — ONDANSETRON HCL 4 MG PO TABS: 4.0000 mg | ORAL_TABLET | Freq: Four times a day (QID) | ORAL | Status: DC | PRN

## 2024-06-17 MED ORDER — ONDANSETRON HCL 4 MG/2ML IJ SOLN
4.0000 mg | Freq: Four times a day (QID) | INTRAMUSCULAR | Status: DC | PRN
  Administered 2024-06-19: 4 mg via INTRAVENOUS
  Filled 2024-06-17: qty 2

## 2024-06-17 MED ORDER — ACETAMINOPHEN 650 MG RE SUPP: 650.0000 mg | Freq: Four times a day (QID) | RECTAL | Status: DC | PRN

## 2024-06-17 MED ORDER — HYDRALAZINE HCL 20 MG/ML IJ SOLN: 10.0000 mg | Freq: Four times a day (QID) | INTRAMUSCULAR | Status: DC | PRN

## 2024-06-17 MED ORDER — IBUPROFEN 800 MG PO TABS
800.0000 mg | ORAL_TABLET | Freq: Four times a day (QID) | ORAL | Status: DC | PRN
  Administered 2024-06-17 – 2024-06-21 (×7): 800 mg via ORAL
  Filled 2024-06-17: qty 1
  Filled 2024-06-17 (×2): qty 4
  Filled 2024-06-17: qty 1
  Filled 2024-06-17 (×4): qty 4

## 2024-06-17 MED ORDER — ACETAMINOPHEN 325 MG PO TABS
650.0000 mg | ORAL_TABLET | Freq: Four times a day (QID) | ORAL | Status: DC | PRN
  Administered 2024-06-18 (×2): 650 mg via ORAL
  Filled 2024-06-17 (×2): qty 2

## 2024-06-17 MED ORDER — MORPHINE SULFATE (PF) 4 MG/ML IV SOLN
4.0000 mg | Freq: Once | INTRAVENOUS | Status: DC
  Filled 2024-06-17: qty 1

## 2024-06-17 MED ORDER — OXYCODONE HCL 5 MG PO TABS: 5.0000 mg | ORAL_TABLET | ORAL | Status: DC | PRN

## 2024-06-17 NOTE — ED Triage Notes (Signed)
 PT BIB [facility] d/t mechanical fall causing left hip injury. Upon ems arrival given 50 mcg fentanyl  and 4mg  zofran  PIV IV r wrist. No loc , no blood thinners.  140/90 BP CBG 178 HR 66 97% RA

## 2024-06-17 NOTE — Progress Notes (Signed)
 Contacted by Dr. Beuford regarding the possibility of hemiarthroplasty for tomorrow. Patient's mobility and lack of medical problems suggest that she may be better suited for total hip replacement.   Will cancel surgery for tomorrow and speak with arthroplasty surgeons regarding potential THA on Monday.  Full consultation to follow tomorrow.  Ozell Bruch, MD [facility], [facility] [phone]

## 2024-06-17 NOTE — ED Provider Notes (Signed)
 "  EMERGENCY DEPARTMENT AT [facility] Provider Note   CSN: [id number] Arrival date & time: 06/17/24  1703     Patient presents with: Left hip injury   Ashley Gutierrez is a 74 y.o. female.   74 year old female with past medical history of hyperlipidemia presenting to the emergency department today after a fall at home.  Patient states that she was putting on her pants when her foot got caught and she fell on her left side.  Patient fell directly on her left hip.  Is reporting pain there.  Was unable to ambulate so was brought to the ER for further evaluation.  She did not hit her head or lose consciousness.  Is not on any blood thinners.  She denies any other injuries.        Prior to Admission medications  Medication Sig Start Date End Date Taking? Authorizing Provider  aspirin  EC 81 MG tablet Take 1 tablet (81 mg total) by mouth daily. Swallow whole. 02/08/22   Remi Pippin, NP  rosuvastatin  (CRESTOR ) 20 MG tablet Take 1 tablet (20 mg total) by mouth daily. Patient taking differently: Take 10 mg by mouth daily. 10mg  02/08/22   Remi Pippin, NP    Allergies: Patient has no known allergies.    Review of Systems  Musculoskeletal:        Left hip pain  All other systems reviewed and are negative.   Updated Vital Signs BP (!) 171/77   Pulse 62   Temp (!) 97.5 F (36.4 C) (Oral)   Resp 17   Ht 5' 7 (1.702 m)   Wt 54.4 kg   SpO2 100%   BMI 18.79 kg/m   Physical Exam Vitals and nursing note reviewed.   Gen: NAD Eyes: PERRL, EOMI HEENT: no oropharyngeal swelling Neck: trachea midline, no cervical spine tenderness, no stepoffs or deformities Resp: clear to auscultation bilaterally Card: RRR, no murmurs, rubs, or gallops Abd: nontender, nondistended, no seatbelt sign Extremities: no calf tenderness, no edema MSK: no thoracic spinal tenderness, no lumbar spinal tenderness, no step-offs or deformities, the patient is tender over the left proximal femur,  pelvis is stable Vascular: 2+ radial pulses bilaterally, 2+ DP pulses bilaterally Neuro: Alert and oriented x 3, equal strength sensation throughout bilateral upper and lower extremities Skin: no rashes   (all labs ordered are listed, but only abnormal results are displayed) Labs Reviewed  BASIC METABOLIC PANEL WITH GFR - Abnormal; Notable for the following components:      Result Value   Glucose, Bld 132 (*)    All other components within normal limits  CBC    EKG: None  Radiology: DG Hip Unilat W or Wo Pelvis 2-3 Views Left Result Date: 06/17/2024 CLINICAL DATA:  Left hip injury after fall EXAM: DG HIP (WITH OR WITHOUT PELVIS) 2-3V LEFT COMPARISON:  None Available. FINDINGS: Moderately displaced proximal left femoral neck fracture is noted. IMPRESSION: Moderately displaced proximal left femoral neck fracture. Electronically Signed   By: Lynwood Landy Raddle M.D.   On: 06/17/2024 17:48     Procedures   Medications Ordered in the ED  morphine  (PF) 4 MG/ML injection 4 mg (4 mg Intravenous Not Given 06/17/24 1755)  ondansetron  (ZOFRAN ) injection 4 mg (4 mg Intravenous Not Given 06/17/24 1755)  Medical Decision Making 74 year old female presents to the emergency department today with left hip pain after a mechanical fall at home.  I will obtain basic labs here as well as an x-ray of her left hip.  This will further evaluate for fracture or dislocation although there is no significant shortening here.  Will give the patient morphine  and Zofran  for pain and reevaluate for ultimate disposition.  The patient's labs are unremarkable.  X-ray does show a left femoral neck fracture.  I did call and discussed this with Dr. Beuford.  Recommends admission and plan is for surgery tomorrow.  She is admitted for further evaluation and management.  Amount and/or Complexity of Data Reviewed Labs: ordered. Radiology: ordered.  Risk Prescription drug  management. Decision regarding hospitalization.        Final diagnoses:  Left displaced femoral neck fracture [facility])    ED Discharge Orders     None          Ula Prentice SAUNDERS, MD 06/17/24 1834  "

## 2024-06-17 NOTE — H&P (Signed)
 " History and Physical    Ashley Gutierrez FMW:[id number] DOB: 07-Sep-1950 DOA: 06/17/2024  PCP: Larnell Hamilton, MD  Patient coming from: Home  I have personally briefly reviewed patient's old medical records in [facility]  Chief Complaint: Fall  HPI: Ashley Gutierrez is a 74 y.o. female with medical history significant of osteoporosis, hyperlipidemia presented with mechanical fall at home.  Patient reports that she was putting on her pants today and her foot got caught and she fell on the left side.  Patient fell directly on her left hip.  She denies any head injury, loss of consciousness, lightheadedness, dizziness prior to the fall.  Patient was unable to ambulate therefore EMS was called.  She is not on any blood thinner.  Reports that she recently diagnosed with osteoporosis last month however she was hesitant to start any treatment.  No history of tobacco abuse, alcohol  abuse, licit drug use.  ED Course: Upon arrival to ED: Patient afebrile, pulse 66, BP 159/88, maintaining oxygen saturation on room air.  WBC: 10.3, H&H 13.9/42.4, PLT 240.  NA: 142, K: 3.6, BG: 132.  Normal kidney function.  X-ray of left hip shows moderately displaced proximal left femoral neck fracture.  EDP consulted orthopedic.  [facility] hospitalist consulted for admission.  Review of Systems: As per HPI otherwise negative.    Past Medical History:  Diagnosis Date   Pneumonia     No past surgical history on file.   reports that she has never smoked. She has never used smokeless tobacco. She reports that she does not drink alcohol  and does not use drugs.  Allergies[1]  Family History  Problem Relation Age of Onset   Diabetes Mother    Hypertension Mother    Hyperlipidemia Mother     Prior to Admission medications  Medication Sig Start Date End Date Taking? Authorizing Provider  aspirin  EC 81 MG tablet Take 1 tablet (81 mg total) by mouth daily. Swallow whole. 02/08/22   Remi Pippin, NP  rosuvastatin   (CRESTOR ) 20 MG tablet Take 1 tablet (20 mg total) by mouth daily. Patient taking differently: Take 10 mg by mouth daily. 10mg  02/08/22   Remi Pippin, NP    Physical Exam: Vitals:   06/17/24 1712 06/17/24 1714 06/17/24 1745 06/17/24 1845  BP: (!) 159/88  (!) 171/77 (!) 167/71  Pulse: 66  62 91  Resp: 17     Temp: (!) 97.5 F (36.4 C)     TempSrc: Oral     SpO2: 100%  100% 100%  Weight:  54.4 kg    Height:  5' 7 (1.702 m)      Constitutional: NAD, calm, comfortable, on room air, communicating well Eyes: PERRL, lids and conjunctivae normal ENMT: Mucous membranes are moist. Posterior pharynx clear of any exudate or lesions.Normal dentition.  Neck: normal, supple, no masses, no thyromegaly Respiratory: clear to auscultation bilaterally, no wheezing, no crackles. Normal respiratory effort. No accessory muscle use.  Cardiovascular: Regular rate and rhythm, no murmurs / rubs / gallops. No extremity edema. 2+ pedal pulses. No carotid bruits.  Abdomen: no tenderness, no masses palpated. No hepatosplenomegaly. Bowel sounds positive.  Musculoskeletal: Tender on left hip area.  No redness or bleeding.  2+ pedal pulses bilaterally. Skin: no rashes, lesions, ulcers. No induration Neurologic: CN 2-12 grossly intact. Sensation intact, DTR normal. Strength 5/5 in all 4.  Psychiatric: Normal judgment and insight. Alert and oriented x 3. Normal mood.    Labs on Admission: I have personally reviewed  following labs and imaging studies  CBC: Recent Labs  Lab 06/17/24 1716  WBC 10.3  HGB 13.9  HCT 42.4  MCV 91.8  PLT 246   Basic Metabolic Panel: Recent Labs  Lab 06/17/24 1716  NA 142  K 3.6  CL 104  CO2 25  GLUCOSE 132*  BUN 13  CREATININE 0.70  CALCIUM  9.2   GFR: Estimated Creatinine Clearance: 53 mL/min (by C-G formula based on SCr of 0.7 mg/dL). Liver Function Tests: No results for input(s): AST, ALT, ALKPHOS, BILITOT, PROT, ALBUMIN in the last 168 hours. No  results for input(s): LIPASE, AMYLASE in the last 168 hours. No results for input(s): AMMONIA in the last 168 hours. Coagulation Profile: No results for input(s): INR, PROTIME in the last 168 hours. Cardiac Enzymes: No results for input(s): CKTOTAL, CKMB, CKMBINDEX, TROPONINI in the last 168 hours. BNP (last 3 results) No results for input(s): PROBNP in the last 8760 hours. HbA1C: No results for input(s): HGBA1C in the last 72 hours. CBG: No results for input(s): GLUCAP in the last 168 hours. Lipid Profile: No results for input(s): CHOL, HDL, LDLCALC, TRIG, CHOLHDL, LDLDIRECT in the last 72 hours. Thyroid Function Tests: No results for input(s): TSH, T4TOTAL, FREET4, T3FREE, THYROIDAB in the last 72 hours. Anemia Panel: No results for input(s): VITAMINB12, FOLATE, FERRITIN, TIBC, IRON, RETICCTPCT in the last 72 hours. Urine analysis:    Component Value Date/Time   COLORURINE STRAW (A) 02/06/2022 0356   APPEARANCEUR CLEAR 02/06/2022 0356   LABSPEC 1.021 02/06/2022 0356   PHURINE 7.0 02/06/2022 0356   GLUCOSEU NEGATIVE 02/06/2022 0356   HGBUR SMALL (A) 02/06/2022 0356   BILIRUBINUR NEGATIVE 02/06/2022 0356   KETONESUR 5 (A) 02/06/2022 0356   PROTEINUR NEGATIVE 02/06/2022 0356   NITRITE NEGATIVE 02/06/2022 0356   LEUKOCYTESUR NEGATIVE 02/06/2022 0356    Radiological Exams on Admission: DG Hip Unilat W or Wo Pelvis 2-3 Views Left Result Date: 06/17/2024 CLINICAL DATA:  Left hip injury after fall EXAM: DG HIP (WITH OR WITHOUT PELVIS) 2-3V LEFT COMPARISON:  None Available. FINDINGS: Moderately displaced proximal left femoral neck fracture is noted. IMPRESSION: Moderately displaced proximal left femoral neck fracture. Electronically Signed   By: Lynwood Landy Raddle M.D.   On: 06/17/2024 17:48    Assessment/Plan  Left femoral neck fracture: - Status post fall at home.  History of osteoporosis.  Currently not on any treatment -  X-ray shows moderately displaced proximal left femoral neck fracture. -Admit patient on the floor.   -Orthopedic consulted-Recommend surgery tomorrow - Will keep her n.p.o. after midnight.   -As needed pain medications. - Monitor H&H.  Osteoporosis: - Not on any treatment  Elevated blood pressure without diagnosis of hypertension: -Likely in the setting of pain. -Monitor blood pressure closely.  Hydralazine  as needed  Hyperlipidemia: On statin at home  DVT prophylaxis: SCD, would hold off chemical prophylaxis in the setting of scheduled procedure tomorrow  Code Status: Full code Family Communication: None present at bedside.  Plan of care discussed with patient in length and he verbalized understanding and agreed with it. Disposition Plan: To be determined Consults called: Orthopedic Admission status: Patient   Velna JONELLE Skeeter MD [facility]  If 7PM-7AM, please contact night-coverage [email or web address]  06/17/2024, 7:02 PM       [1] No Known Allergies  "

## 2024-06-17 NOTE — ED Notes (Signed)
"  Xray at bedside.   "

## 2024-06-18 ENCOUNTER — Inpatient Hospital Stay (HOSPITAL_COMMUNITY)

## 2024-06-18 DIAGNOSIS — S72042A Displaced fracture of base of neck of left femur, initial encounter for closed fracture: Secondary | ICD-10-CM

## 2024-06-18 LAB — CBC
HCT: 41.3 % (ref 36.0–46.0)
Hemoglobin: 14.2 g/dL (ref 12.0–15.0)
MCH: 30.7 pg (ref 26.0–34.0)
MCHC: 34.4 g/dL (ref 30.0–36.0)
MCV: 89.2 fL (ref 80.0–100.0)
Platelets: 239 K/uL (ref 150–400)
RBC: 4.63 MIL/uL (ref 3.87–5.11)
RDW: 13 % (ref 11.5–15.5)
WBC: 12.6 K/uL — ABNORMAL HIGH (ref 4.0–10.5)
nRBC: 0 % (ref 0.0–0.2)

## 2024-06-18 LAB — ABO/RH: ABO/RH(D): A NEG

## 2024-06-18 LAB — SURGICAL PCR SCREEN
MRSA, PCR: NEGATIVE
Staphylococcus aureus: POSITIVE — AB

## 2024-06-18 LAB — TYPE AND SCREEN
ABO/RH(D): A NEG
Antibody Screen: NEGATIVE

## 2024-06-18 LAB — BASIC METABOLIC PANEL WITH GFR
Anion gap: 10 (ref 5–15)
BUN: 9 mg/dL (ref 8–23)
CO2: 26 mmol/L (ref 22–32)
Calcium: 9.2 mg/dL (ref 8.9–10.3)
Chloride: 104 mmol/L (ref 98–111)
Creatinine, Ser: 0.63 mg/dL (ref 0.44–1.00)
GFR, Estimated: >60 mL/min
Glucose, Bld: 110 mg/dL — ABNORMAL HIGH (ref 70–99)
Potassium: 3.9 mmol/L (ref 3.5–5.1)
Sodium: 140 mmol/L (ref 135–145)

## 2024-06-18 MED ORDER — HYDRALAZINE HCL 20 MG/ML IJ SOLN
10.0000 mg | Freq: Four times a day (QID) | INTRAMUSCULAR | Status: DC | PRN
  Administered 2024-06-19: 10 mg via INTRAVENOUS
  Filled 2024-06-18: qty 1

## 2024-06-18 MED ORDER — ROSUVASTATIN CALCIUM 5 MG PO TABS
10.0000 mg | ORAL_TABLET | Freq: Every morning | ORAL | Status: DC
  Administered 2024-06-18 – 2024-06-21 (×3): 10 mg via ORAL
  Filled 2024-06-18 (×3): qty 2

## 2024-06-18 MED ORDER — CEFAZOLIN SODIUM-DEXTROSE 2-4 GM/100ML-% IV SOLN
2.0000 g | INTRAVENOUS | Status: AC
  Administered 2024-06-19: 2 g via INTRAVENOUS
  Filled 2024-06-18: qty 100

## 2024-06-18 MED ORDER — ENOXAPARIN SODIUM 40 MG/0.4ML IJ SOSY
40.0000 mg | PREFILLED_SYRINGE | Freq: Once | INTRAMUSCULAR | Status: AC
  Administered 2024-06-18: 40 mg via SUBCUTANEOUS
  Filled 2024-06-18: qty 0.4

## 2024-06-18 NOTE — Anesthesia Preprocedure Evaluation (Signed)
 "                                  Anesthesia Evaluation  Patient identified by MRN, date of birth, ID band Patient awake    Reviewed: Allergy & Precautions, NPO status , Patient's Chart, lab work & pertinent test results  Airway Mallampati: II  TM Distance: >3 FB Neck ROM: Full    Dental no notable dental hx. (+) Teeth Intact, Dental Advisory Given   Pulmonary neg pulmonary ROS   Pulmonary exam normal breath sounds clear to auscultation       Cardiovascular hypertension, (-) angina (-) Past MI Normal cardiovascular exam Rhythm:Regular Rate:Normal  2023 TTE  1. Left ventricular ejection fraction, by estimation, is 65 to 70%. The  left ventricle has normal function. The left ventricle has no regional  wall motion abnormalities. Left ventricular diastolic parameters are  consistent with Grade I diastolic  dysfunction (impaired relaxation).   2. Right ventricular systolic function is normal. The right ventricular  size is normal. There is normal pulmonary artery systolic pressure.   3. A small pericardial effusion is present. The pericardial effusion is  posterior to the left ventricle.   4. The mitral valve is normal in structure. No evidence of mitral valve  regurgitation. No evidence of mitral stenosis.   5. The aortic valve is tricuspid. Aortic valve regurgitation is not  visualized. No aortic stenosis is present.   6. The inferior vena cava is dilated in size with >50% respiratory  variability, suggesting right atrial pressure of 8 mmHg.   7. Cannot exclude a small PFO.      Neuro/Psych CVA, Residual Symptoms    GI/Hepatic negative GI ROS, Neg liver ROS,,,  Endo/Other  negative endocrine ROS    Renal/GU Lab Results      Component                Value               Date                          K                        3.9                 06/18/2024                CO2                      26                  06/18/2024                BUN                       9                   06/18/2024                CREATININE               0.63                06/18/2024  Musculoskeletal negative musculoskeletal ROS (+)    Abdominal   Peds  Hematology On lovenox  Lab Results      Component                Value               Date                      WBC                      12.6 (H)            06/18/2024                HGB                      14.2                06/18/2024                HCT                      41.3                06/18/2024                MCV                      89.2                06/18/2024                PLT                      239                 06/18/2024              Anesthesia Other Findings NKDA  Reproductive/Obstetrics negative OB ROS                              Anesthesia Physical Anesthesia Plan  ASA: 3  Anesthesia Plan: General   Post-op Pain Management: Tylenol  PO (pre-op)* and Precedex   Induction: Intravenous  PONV Risk Score and Plan: 3 and Treatment may vary due to age or medical condition, Ondansetron  and Dexamethasone   Airway Management Planned: Oral ETT  Additional Equipment: None  Intra-op Plan:   Post-operative Plan: Extubation in OR  Informed Consent: I have reviewed the patients History and Physical, chart, labs and discussed the procedure including the risks, benefits and alternatives for the proposed anesthesia with the patient or authorized representative who has indicated his/her understanding and acceptance.     Dental advisory given  Plan Discussed with: CRNA and Surgeon  Anesthesia Plan Comments:          Anesthesia Quick Evaluation  "

## 2024-06-18 NOTE — Progress Notes (Signed)
. ° ° °  PROCEDURAL EXPEDITER PROGRESS NOTE  Patient Name: Ashley Gutierrez  DOB:1950-11-08 Date of Admission: 06/17/2024  Date of Assessment:06/18/2024   -------------------------------------------------------------------------------------------------------------------   Brief clinical summary: Pt to the OR tomorrow for a left total hip arthoplasty  Orders in place:  Yes   Labs, test, and orders reviewed:  Y  Requires surgical clearance:  No  Barriers noted: Needs T&S and surgical PCR   Intervention provided by [facility] team: Placed orders  Barrier resolved:  yes   -------------------------------------------------------------------------------------------------------------------  [employer], [place], [place] Please contact us  directly via secure chat (search for [facility]) or by calling us  at [phone] [facility]).

## 2024-06-18 NOTE — H&P (View-Only) (Signed)
 "             Orthopaedic Trauma Service (OTS) Consult   Patient ID: Ashley Gutierrez MRN: [id number] DOB/AGE: 75-May-1952 75 y.o.   Reason for Consult: Left femoral neck fracture Referring Physician: Velna Skeeter, MD    HPI: Ashley Gutierrez is an 74 y.o. female s/p ground-level fall on 06/17/2024.  Patient lost her balance while getting dressed and fell directly on her left hip.  She was unable to ambulate.  Brought to [facility] where she was found to have a left femoral neck fracture.  She is a quite active.  Fairly healthy with the exception of osteoporosis.  She is on a statin.  No chronic anticoagulation other than baby aspirin .  Patient complains of pain to her left hip only.  Relieved with rest exacerbated with movement.  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  They do also have a home health care service that provides assistance during the week as well   Past Medical History:  Diagnosis Date   Pneumonia     No past surgical history on file.  Family History  Problem Relation Age of Onset   Diabetes Mother    Hypertension Mother    Hyperlipidemia Mother     Social History:  reports that she has never smoked. She has never used smokeless tobacco. She reports that she does not drink alcohol  and does not use drugs.  Allergies: Allergies[1]  Medications: I have reviewed the patient's current medications. Current Outpatient Medications  Medication Instructions   aspirin  EC 81 mg, Oral, Daily, Swallow whole.   rosuvastatin  (CRESTOR ) 10 mg, Oral, Every morning     Results for orders placed or performed during the hospital encounter of 06/17/24 (from the past 48 hours)  CBC     Status: None   Collection Time: 06/17/24  5:16 PM  Result Value Ref Range   WBC 10.3 4.0 - 10.5 K/uL   RBC 4.62 3.87 - 5.11 MIL/uL   Hemoglobin 13.9 12.0 - 15.0 g/dL   HCT  57.5 63.9 - 53.9 %   MCV 91.8 80.0 - 100.0 fL   MCH 30.1 26.0 - 34.0 pg   MCHC 32.8 30.0 - 36.0 g/dL   RDW 86.9 88.4 - 84.4 %   Platelets 246 150 - 400 K/uL   nRBC 0.0 0.0 - 0.2 %    Comment: Performed at [facility], [address] [place], [place] [address]  Basic metabolic panel     Status: Abnormal   Collection Time: 06/17/24  5:16 PM  Result Value Ref Range   Sodium 142 135 - 145 mmol/L   Potassium 3.6 3.5 - 5.1 mmol/L    Comment: HEMOLYSIS AT THIS LEVEL MAY AFFECT RESULT   Chloride 104 98 - 111 mmol/L   CO2 25 22 - 32 mmol/L   Glucose, Bld 132 (H) 70 - 99 mg/dL    Comment: Glucose reference range applies only to samples taken after fasting for at least 8 hours.   BUN 13 8 - 23 mg/dL   Creatinine, Ser 9.29 0.44 - 1.00 mg/dL   Calcium  9.2 8.9 - 10.3 mg/dL   GFR, Estimated >39 >39 mL/min    Comment: (NOTE) Calculated using the CKD-EPI Creatinine Equation (2021)    Anion gap 13 5 - 15    Comment: Performed at [facility], [address] [place], [place] [address]  Basic metabolic panel     Status: Abnormal   Collection Time: 06/18/24  5:00 AM  Result Value Ref Range   Sodium 140 135 - 145 mmol/L   Potassium 3.9 3.5 - 5.1 mmol/L   Chloride 104 98 - 111 mmol/L   CO2 26 22 - 32 mmol/L   Glucose, Bld 110 (H) 70 - 99 mg/dL    Comment: Glucose reference range applies only to samples taken after fasting for at least 8 hours.   BUN 9 8 - 23 mg/dL   Creatinine, Ser 9.36 0.44 - 1.00 mg/dL   Calcium  9.2 8.9 - 10.3 mg/dL   GFR, Estimated >39 >39 mL/min    Comment: (NOTE) Calculated using the CKD-EPI Creatinine Equation (2021)    Anion gap 10 5 - 15    Comment: Performed at [facility], [address], [place], [place] [address]  CBC     Status: Abnormal   Collection Time: 06/18/24  5:00 AM  Result Value Ref Range   WBC 12.6 (H) 4.0 - 10.5 K/uL   RBC 4.63 3.87 - 5.11 MIL/uL   Hemoglobin 14.2 12.0 - 15.0 g/dL   HCT 58.6 63.9 - 53.9 %   MCV 89.2 80.0 -  100.0 fL   MCH 30.7 26.0 - 34.0 pg   MCHC 34.4 30.0 - 36.0 g/dL   RDW 86.9 88.4 - 84.4 %   Platelets 239 150 - 400 K/uL   nRBC 0.0 0.0 - 0.2 %    Comment: Performed at [facility], [address], [place], [place] [address]    DG Hip Burnis ORN or Wo Pelvis 2-3 Views Left Result Date: 06/17/2024 CLINICAL DATA:  Left hip injury after fall EXAM: DG HIP (WITH OR WITHOUT PELVIS) 2-3V LEFT COMPARISON:  None Available. FINDINGS: Moderately displaced proximal left femoral neck fracture is noted. IMPRESSION: Moderately displaced proximal left femoral neck fracture. Electronically Signed   By: Lynwood Landy Raddle M.D.   On: 06/17/2024 17:48    Intake/Output    None      ROS Left hip pain Blood pressure (!) 170/78, pulse 70, temperature 99 F (37.2 C), resp. rate 17, height 5' 7 (1.702 m), weight 54.4 kg, SpO2 99%. Physical Exam Vitals and nursing note reviewed.  Constitutional:      General: She is awake. She is not in acute distress.    Appearance: Normal appearance. She is normal weight.  Cardiovascular:     Heart sounds: S1 normal and S2 normal.  Pulmonary:     Effort: No respiratory distress.  Musculoskeletal:     Comments:  Pelvis and L Lower extremity   No instability with manipulation of her pelvis.  No pain with lateral compression of her pelvis No acute deformities  Patient is tender to left hip No traumatic wounds to left hip She is resting comfortably with her hip in flexion No knee tenderness  Lower leg and ankle are nontender Distal motor and sensory functions are intact Extremity is warm No swelling of significance   Neurological:     Mental Status: She is alert.  Psychiatric:        Behavior: Behavior is cooperative.       Assessment/Plan:  74 year old female ground-level fall with left femoral neck fracture  -Fall  -Left femoral neck fracture  OR tomorrow for left total hip arthroplasty  Bedrest for now  Therapies postoperatively  -FEN N.p.o.  after  midnight  -Osteoporosis  Fractures of fragility fracture  Will need follow-up with her PCP or provider following her osteoporosis to discuss further medical management and prevention of secondary fracture  - dispo   OR tomorrow am   Francis MICAEL Mt, PA-C [phone] (C) 06/18/2024, 1:17 PM  [facility] [address] [place] [address] [phone] [phone] (F)    After 5pm and on the weekends please log on to Amion, go to orthopaedics and the look under the [facility] Call for the provider(s) on call. You can also call our office at [phone] and then follow the prompts to be connected to the call team.      [1] No Known Allergies  "

## 2024-06-18 NOTE — Progress Notes (Signed)
 " PROGRESS NOTE    Ashley Gutierrez  FMW:[id number] DOB: Feb 18, 1951 DOA: 06/17/2024 PCP: Larnell Hamilton, MD   Brief Narrative:  Ashley Gutierrez is a 74 y.o. female with medical history significant of osteoporosis, hyperlipidemia presented with mechanical fall at home.She denied any head injury, loss of consciousness, lightheadedness, dizziness prior to the fall.  Patient was unable to ambulate therefore EMS was called.  She is not on any blood thinner.  On arrival to ED, she was fairly hemodynamically stable, x-ray of the left hip showed moderately displaced proximal left femoral neck fracture.  Orthopedics consulted.  Assessment & Plan:   Principal Problem:   Femoral fracture (HCC)  Left femoral neck fracture status post fall at home/history of osteoporosis: X-ray confirms moderately displaced proximal left femoral neck fracture.  Orthopedics consulted.  Per them, patient requires total hip arthroplasty, potentially scheduled for tomorrow.  Will be n.p.o. from midnight.  DVT prophylaxis Lovenox  was held yesterday with potential planned of surgery today however since the surgery is tomorrow, I will give her a dose of Lovenox  today once orthopedics clears.  Elevated blood pressure: No prior history of hypertension.  Blood pressure elevated likely secondary to pain.  Continue as needed hydralazine .  Hyperlipidemia: Resume Crestor   DVT prophylaxis: SCDs Start: 06/17/24 1854   Code Status: Full Code  Family Communication:  None present at bedside.  Plan of care discussed with patient in length and he/she verbalized understanding and agreed with it.  Status is: Inpatient Remains inpatient appropriate because: Needs hip surgery   Estimated body mass index is 18.79 kg/m as calculated from the following:   Height as of this encounter: 5' 7 (1.702 m).   Weight as of this encounter: 54.4 kg.    Nutritional Assessment: Body mass index is 18.79 kg/m.Ashley Gutierrez Seen by dietician.  I agree with the  assessment and plan as outlined below: Nutrition Status:        . Skin Assessment: I have examined the patient's skin and I agree with the wound assessment as performed by the wound care RN as outlined below:    Consultants:  Orthopedics  Procedures:  As above  Antimicrobials:  Anti-infectives (From admission, onward)    None         Subjective: Seen and examined, she has no complaints.  Pain is very well-controlled.  Objective: Vitals:   06/18/24 0200 06/18/24 0500 06/18/24 0756 06/18/24 0817  BP: (!) 149/79 (!) 175/86  (!) 176/89  Pulse: 62 70  66  Resp: 14 18  17   Temp:  97.9 F (36.6 C)    TempSrc:  Oral    SpO2: 99% 100% 100% 100%  Weight:      Height:       No intake or output data in the 24 hours ending 06/18/24 0818 Filed Weights   06/17/24 1714  Weight: 54.4 kg    Examination:  General exam: Appears calm and comfortable  Respiratory system: Clear to auscultation. Respiratory effort normal. Cardiovascular system: S1 & S2 heard, RRR. No JVD, murmurs, rubs, gallops or clicks. No pedal edema. Gastrointestinal system: Abdomen is nondistended, soft and nontender. No organomegaly or masses felt. Normal bowel sounds heard. Central nervous system: Alert and oriented. No focal neurological deficits. Extremities: Left lower extremity shortened and externally rotated Skin: No rashes, lesions or ulcers Psychiatry: Judgement and insight appear normal. Mood & affect appropriate.    Data Reviewed: I have personally reviewed following labs and imaging studies  CBC: Recent Labs  Lab  06/17/24 1716 06/18/24 0500  WBC 10.3 12.6*  HGB 13.9 14.2  HCT 42.4 41.3  MCV 91.8 89.2  PLT 246 239   Basic Metabolic Panel: Recent Labs  Lab 06/17/24 1716 06/18/24 0500  NA 142 140  K 3.6 3.9  CL 104 104  CO2 25 26  GLUCOSE 132* 110*  BUN 13 9  CREATININE 0.70 0.63  CALCIUM  9.2 9.2   GFR: Estimated Creatinine Clearance: 53 mL/min (by C-G formula based on  SCr of 0.63 mg/dL). Liver Function Tests: No results for input(s): AST, ALT, ALKPHOS, BILITOT, PROT, ALBUMIN in the last 168 hours. No results for input(s): LIPASE, AMYLASE in the last 168 hours. No results for input(s): AMMONIA in the last 168 hours. Coagulation Profile: No results for input(s): INR, PROTIME in the last 168 hours. Cardiac Enzymes: No results for input(s): CKTOTAL, CKMB, CKMBINDEX, TROPONINI in the last 168 hours. BNP (last 3 results) No results for input(s): PROBNP in the last 8760 hours. HbA1C: No results for input(s): HGBA1C in the last 72 hours. CBG: No results for input(s): GLUCAP in the last 168 hours. Lipid Profile: No results for input(s): CHOL, HDL, LDLCALC, TRIG, CHOLHDL, LDLDIRECT in the last 72 hours. Thyroid Function Tests: No results for input(s): TSH, T4TOTAL, FREET4, T3FREE, THYROIDAB in the last 72 hours. Anemia Panel: No results for input(s): VITAMINB12, FOLATE, FERRITIN, TIBC, IRON, RETICCTPCT in the last 72 hours. Sepsis Labs: No results for input(s): PROCALCITON, LATICACIDVEN in the last 168 hours.  No results found for this or any previous visit (from the past 240 hours).   Radiology Studies: DG Hip Unilat W or Wo Pelvis 2-3 Views Left Result Date: 06/17/2024 CLINICAL DATA:  Left hip injury after fall EXAM: DG HIP (WITH OR WITHOUT PELVIS) 2-3V LEFT COMPARISON:  None Available. FINDINGS: Moderately displaced proximal left femoral neck fracture is noted. IMPRESSION: Moderately displaced proximal left femoral neck fracture. Electronically Signed   By: Lynwood Landy Raddle M.D.   On: 06/17/2024 17:48    Scheduled Meds:   morphine  injection  4 mg Intravenous Once   ondansetron  (ZOFRAN ) IV  4 mg Intravenous Once   Continuous Infusions:   LOS: 1 day   Fredia Skeeter, MD [facility]  06/18/2024, 8:18 AM   *Please note that this is a verbal dictation therefore any spelling  or grammatical errors are due to the Dragon Medical One system interpretation.  Please page via Amion and do not message via secure chat for urgent patient care matters. Secure chat can be used for non urgent patient care matters.  How to contact the [facility] Attending or Consulting provider 7A - 7P or covering provider during after hours 7P -7A, for this patient?  Check the care team in [facility] and look for a) attending/consulting [facility] provider listed and b) the [facility] team listed. Page or secure chat 7A-7P. Log into [email or web address] and use [facility]'s universal password to access. If you do not have the password, please contact the hospital operator. Locate the [facility] provider you are looking for under [facility] and page to a number that you can be directly reached. If you still have difficulty reaching the provider, please page the [facility] (Director on Call) for the Hospitalists listed on amion for assistance.  "

## 2024-06-18 NOTE — Consult Note (Cosign Needed Addendum)
 "             Orthopaedic Trauma Service (OTS) Consult   Patient ID: Ashley Gutierrez MRN: [id number] DOB/AGE: 75-May-1952 75 y.o.   Reason for Consult: Left femoral neck fracture Referring Physician: Velna Skeeter, MD    HPI: Ashley Gutierrez is an 74 y.o. female s/p ground-level fall on 06/17/2024.  Patient lost her balance while getting dressed and fell directly on her left hip.  She was unable to ambulate.  Brought to [facility] where she was found to have a left femoral neck fracture.  She is a quite active.  Fairly healthy with the exception of osteoporosis.  She is on a statin.  No chronic anticoagulation other than baby aspirin .  Patient complains of pain to her left hip only.  Relieved with rest exacerbated with movement.  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  They do also have a home health care service that provides assistance during the week as well   Past Medical History:  Diagnosis Date   Pneumonia     No past surgical history on file.  Family History  Problem Relation Age of Onset   Diabetes Mother    Hypertension Mother    Hyperlipidemia Mother     Social History:  reports that she has never smoked. She has never used smokeless tobacco. She reports that she does not drink alcohol  and does not use drugs.  Allergies: Allergies[1]  Medications: I have reviewed the patient's current medications. Current Outpatient Medications  Medication Instructions   aspirin  EC 81 mg, Oral, Daily, Swallow whole.   rosuvastatin  (CRESTOR ) 10 mg, Oral, Every morning     Results for orders placed or performed during the hospital encounter of 06/17/24 (from the past 48 hours)  CBC     Status: None   Collection Time: 06/17/24  5:16 PM  Result Value Ref Range   WBC 10.3 4.0 - 10.5 K/uL   RBC 4.62 3.87 - 5.11 MIL/uL   Hemoglobin 13.9 12.0 - 15.0 g/dL   HCT  57.5 63.9 - 53.9 %   MCV 91.8 80.0 - 100.0 fL   MCH 30.1 26.0 - 34.0 pg   MCHC 32.8 30.0 - 36.0 g/dL   RDW 86.9 88.4 - 84.4 %   Platelets 246 150 - 400 K/uL   nRBC 0.0 0.0 - 0.2 %    Comment: Performed at [facility], [address] [place], [place] [address]  Basic metabolic panel     Status: Abnormal   Collection Time: 06/17/24  5:16 PM  Result Value Ref Range   Sodium 142 135 - 145 mmol/L   Potassium 3.6 3.5 - 5.1 mmol/L    Comment: HEMOLYSIS AT THIS LEVEL MAY AFFECT RESULT   Chloride 104 98 - 111 mmol/L   CO2 25 22 - 32 mmol/L   Glucose, Bld 132 (H) 70 - 99 mg/dL    Comment: Glucose reference range applies only to samples taken after fasting for at least 8 hours.   BUN 13 8 - 23 mg/dL   Creatinine, Ser 9.29 0.44 - 1.00 mg/dL   Calcium  9.2 8.9 - 10.3 mg/dL   GFR, Estimated >39 >39 mL/min    Comment: (NOTE) Calculated using the CKD-EPI Creatinine Equation (2021)    Anion gap 13 5 - 15    Comment: Performed at [facility], [address] [place], [place] [address]  Basic metabolic panel     Status: Abnormal   Collection Time: 06/18/24  5:00 AM  Result Value Ref Range   Sodium 140 135 - 145 mmol/L   Potassium 3.9 3.5 - 5.1 mmol/L   Chloride 104 98 - 111 mmol/L   CO2 26 22 - 32 mmol/L   Glucose, Bld 110 (H) 70 - 99 mg/dL    Comment: Glucose reference range applies only to samples taken after fasting for at least 8 hours.   BUN 9 8 - 23 mg/dL   Creatinine, Ser 9.36 0.44 - 1.00 mg/dL   Calcium  9.2 8.9 - 10.3 mg/dL   GFR, Estimated >39 >39 mL/min    Comment: (NOTE) Calculated using the CKD-EPI Creatinine Equation (2021)    Anion gap 10 5 - 15    Comment: Performed at [facility], [address], [place], [place] [address]  CBC     Status: Abnormal   Collection Time: 06/18/24  5:00 AM  Result Value Ref Range   WBC 12.6 (H) 4.0 - 10.5 K/uL   RBC 4.63 3.87 - 5.11 MIL/uL   Hemoglobin 14.2 12.0 - 15.0 g/dL   HCT 58.6 63.9 - 53.9 %   MCV 89.2 80.0 -  100.0 fL   MCH 30.7 26.0 - 34.0 pg   MCHC 34.4 30.0 - 36.0 g/dL   RDW 86.9 88.4 - 84.4 %   Platelets 239 150 - 400 K/uL   nRBC 0.0 0.0 - 0.2 %    Comment: Performed at [facility], [address], [place], [place] [address]    DG Hip Burnis ORN or Wo Pelvis 2-3 Views Left Result Date: 06/17/2024 CLINICAL DATA:  Left hip injury after fall EXAM: DG HIP (WITH OR WITHOUT PELVIS) 2-3V LEFT COMPARISON:  None Available. FINDINGS: Moderately displaced proximal left femoral neck fracture is noted. IMPRESSION: Moderately displaced proximal left femoral neck fracture. Electronically Signed   By: Lynwood Landy Raddle M.D.   On: 06/17/2024 17:48    Intake/Output    None      ROS Left hip pain Blood pressure (!) 170/78, pulse 70, temperature 99 F (37.2 C), resp. rate 17, height 5' 7 (1.702 m), weight 54.4 kg, SpO2 99%. Physical Exam Vitals and nursing note reviewed.  Constitutional:      General: She is awake. She is not in acute distress.    Appearance: Normal appearance. She is normal weight.  Cardiovascular:     Heart sounds: S1 normal and S2 normal.  Pulmonary:     Effort: No respiratory distress.  Musculoskeletal:     Comments:  Pelvis and L Lower extremity   No instability with manipulation of her pelvis.  No pain with lateral compression of her pelvis No acute deformities  Patient is tender to left hip No traumatic wounds to left hip She is resting comfortably with her hip in flexion No knee tenderness  Lower leg and ankle are nontender Distal motor and sensory functions are intact Extremity is warm No swelling of significance   Neurological:     Mental Status: She is alert.  Psychiatric:        Behavior: Behavior is cooperative.       Assessment/Plan:  74 year old female ground-level fall with left femoral neck fracture  -Fall  -Left femoral neck fracture  OR tomorrow for left total hip arthroplasty  Bedrest for now  Therapies postoperatively  -FEN N.p.o.  after  midnight  -Osteoporosis  Fractures of fragility fracture  Will need follow-up with her PCP or provider following her osteoporosis to discuss further medical management and prevention of secondary fracture  - dispo   OR tomorrow am   Francis MICAEL Mt, PA-C [phone] (C) 06/18/2024, 1:17 PM  [facility] [address] [place] [address] [phone] [phone] (F)    After 5pm and on the weekends please log on to Amion, go to orthopaedics and the look under the [facility] Call for the provider(s) on call. You can also call our office at [phone] and then follow the prompts to be connected to the call team.      [1] No Known Allergies  "

## 2024-06-19 ENCOUNTER — Encounter (HOSPITAL_COMMUNITY): Payer: Self-pay | Admitting: Anesthesiology

## 2024-06-19 ENCOUNTER — Inpatient Hospital Stay (HOSPITAL_COMMUNITY)

## 2024-06-19 ENCOUNTER — Encounter (HOSPITAL_COMMUNITY): Payer: Self-pay | Admitting: Internal Medicine

## 2024-06-19 ENCOUNTER — Inpatient Hospital Stay (HOSPITAL_COMMUNITY): Payer: Self-pay | Admitting: Anesthesiology

## 2024-06-19 ENCOUNTER — Encounter (HOSPITAL_COMMUNITY)
Admission: EM | Disposition: A | Discharge status: Skilled Nursing Facility | Payer: Self-pay | Source: Home / Self Care | Attending: Internal Medicine

## 2024-06-19 DIAGNOSIS — S72042A Displaced fracture of base of neck of left femur, initial encounter for closed fracture: Secondary | ICD-10-CM | POA: Diagnosis not present

## 2024-06-19 DIAGNOSIS — E785 Hyperlipidemia, unspecified: Secondary | ICD-10-CM

## 2024-06-19 DIAGNOSIS — S72002A Fracture of unspecified part of neck of left femur, initial encounter for closed fracture: Secondary | ICD-10-CM

## 2024-06-19 DIAGNOSIS — I10 Essential (primary) hypertension: Secondary | ICD-10-CM | POA: Diagnosis not present

## 2024-06-19 HISTORY — PX: TOTAL HIP ARTHROPLASTY: SHX124

## 2024-06-19 LAB — BASIC METABOLIC PANEL WITH GFR
Anion gap: 11 (ref 5–15)
BUN: 11 mg/dL (ref 8–23)
CO2: 26 mmol/L (ref 22–32)
Calcium: 9.2 mg/dL (ref 8.9–10.3)
Chloride: 104 mmol/L (ref 98–111)
Creatinine, Ser: 0.69 mg/dL (ref 0.44–1.00)
GFR, Estimated: >60 mL/min
Glucose, Bld: 101 mg/dL — ABNORMAL HIGH (ref 70–99)
Potassium: 3.8 mmol/L (ref 3.5–5.1)
Sodium: 141 mmol/L (ref 135–145)

## 2024-06-19 LAB — CBC WITH DIFFERENTIAL/PLATELET
Abs Immature Granulocytes: 0.04 K/uL (ref 0.00–0.07)
Basophils Absolute: 0 K/uL (ref 0.0–0.1)
Basophils Relative: 0 %
Eosinophils Absolute: 0.1 K/uL (ref 0.0–0.5)
Eosinophils Relative: 1 %
HCT: 45 % (ref 36.0–46.0)
Hemoglobin: 15.4 g/dL — ABNORMAL HIGH (ref 12.0–15.0)
Immature Granulocytes: 0 %
Lymphocytes Relative: 15 %
Lymphs Abs: 1.3 K/uL (ref 0.7–4.0)
MCH: 31 pg (ref 26.0–34.0)
MCHC: 34.2 g/dL (ref 30.0–36.0)
MCV: 90.5 fL (ref 80.0–100.0)
Monocytes Absolute: 0.6 K/uL (ref 0.1–1.0)
Monocytes Relative: 7 %
Neutro Abs: 7.1 K/uL (ref 1.7–7.7)
Neutrophils Relative %: 77 %
Platelets: 226 K/uL (ref 150–400)
RBC: 4.97 MIL/uL (ref 3.87–5.11)
RDW: 13 % (ref 11.5–15.5)
WBC: 9.3 K/uL (ref 4.0–10.5)
nRBC: 0 % (ref 0.0–0.2)

## 2024-06-19 LAB — VITAMIN D 25 HYDROXY (VIT D DEFICIENCY, FRACTURES): Vit D, 25-Hydroxy: 26.5 ng/mL — ABNORMAL LOW (ref 30–100)

## 2024-06-19 MED ORDER — FENTANYL CITRATE (PF) 250 MCG/5ML IJ SOLN
INTRAMUSCULAR | Status: AC
  Filled 2024-06-19: qty 5

## 2024-06-19 MED ORDER — METOCLOPRAMIDE HCL 5 MG/ML IJ SOLN: 5.0000 mg | Freq: Three times a day (TID) | INTRAMUSCULAR | Status: DC | PRN

## 2024-06-19 MED ORDER — OXYCODONE HCL 5 MG PO TABS
2.5000 mg | ORAL_TABLET | ORAL | Status: DC | PRN
  Filled 2024-06-19: qty 1

## 2024-06-19 MED ORDER — MUPIROCIN 2 % EX OINT: 1.0000 | TOPICAL_OINTMENT | Freq: Two times a day (BID) | CUTANEOUS | 0 refills | Status: AC

## 2024-06-19 MED ORDER — LIDOCAINE 2% (20 MG/ML) 5 ML SYRINGE
INTRAMUSCULAR | Status: DC | PRN
  Administered 2024-06-19: 60 mg via INTRAVENOUS

## 2024-06-19 MED ORDER — VANCOMYCIN HCL 1000 MG IV SOLR
INTRAVENOUS | Status: AC
  Filled 2024-06-19: qty 20

## 2024-06-19 MED ORDER — DIPHENHYDRAMINE HCL 12.5 MG/5ML PO ELIX
12.5000 mg | ORAL_SOLUTION | ORAL | Status: DC | PRN
  Filled 2024-06-19: qty 10

## 2024-06-19 MED ORDER — TRANEXAMIC ACID-NACL 1000-0.7 MG/100ML-% IV SOLN
1000.0000 mg | Freq: Once | INTRAVENOUS | Status: AC
  Administered 2024-06-19: 1000 mg via INTRAVENOUS
  Filled 2024-06-19: qty 100

## 2024-06-19 MED ORDER — METHOCARBAMOL 500 MG PO TABS: 500.0000 mg | ORAL_TABLET | Freq: Three times a day (TID) | ORAL | Status: DC | PRN

## 2024-06-19 MED ORDER — OXYCODONE HCL 5 MG/5ML PO SOLN: 5.0000 mg | Freq: Once | ORAL | Status: DC | PRN

## 2024-06-19 MED ORDER — ONDANSETRON HCL 4 MG/2ML IJ SOLN: 4.0000 mg | Freq: Once | INTRAMUSCULAR | Status: DC | PRN

## 2024-06-19 MED ORDER — ONDANSETRON HCL 4 MG/2ML IJ SOLN
INTRAMUSCULAR | Status: AC
  Filled 2024-06-19: qty 2

## 2024-06-19 MED ORDER — ACETAMINOPHEN 10 MG/ML IV SOLN
1000.0000 mg | Freq: Once | INTRAVENOUS | Status: DC | PRN
  Administered 2024-06-19: 1000 mg via INTRAVENOUS

## 2024-06-19 MED ORDER — CHLORHEXIDINE GLUCONATE 0.12 % MT SOLN: 15.0000 mL | Freq: Once | OROMUCOSAL | Status: AC

## 2024-06-19 MED ORDER — METOCLOPRAMIDE HCL 5 MG PO TABS
5.0000 mg | ORAL_TABLET | Freq: Three times a day (TID) | ORAL | Status: DC | PRN
  Administered 2024-06-19: 10 mg via ORAL
  Filled 2024-06-19: qty 2

## 2024-06-19 MED ORDER — HYDROMORPHONE HCL 1 MG/ML IJ SOLN
INTRAMUSCULAR | Status: AC
  Filled 2024-06-19: qty 1

## 2024-06-19 MED ORDER — TRANEXAMIC ACID-NACL 1000-0.7 MG/100ML-% IV SOLN
INTRAVENOUS | Status: AC
  Filled 2024-06-19: qty 100

## 2024-06-19 MED ORDER — ACETAMINOPHEN 10 MG/ML IV SOLN
INTRAVENOUS | Status: AC
  Filled 2024-06-19: qty 100

## 2024-06-19 MED ORDER — CHLORHEXIDINE GLUCONATE 4 % EX SOLN: 1.0000 | CUTANEOUS | 1 refills | Status: AC

## 2024-06-19 MED ORDER — DOCUSATE SODIUM 100 MG PO CAPS
100.0000 mg | ORAL_CAPSULE | Freq: Two times a day (BID) | ORAL | Status: DC
  Administered 2024-06-19 – 2024-06-21 (×4): 100 mg via ORAL
  Filled 2024-06-19 (×5): qty 1

## 2024-06-19 MED ORDER — MUPIROCIN 2 % EX OINT
1.0000 | TOPICAL_OINTMENT | Freq: Two times a day (BID) | CUTANEOUS | Status: DC
  Administered 2024-06-19 – 2024-06-21 (×4): 1 via NASAL
  Filled 2024-06-19: qty 22

## 2024-06-19 MED ORDER — METHOCARBAMOL 1000 MG/10ML IJ SOLN: 500.0000 mg | Freq: Three times a day (TID) | INTRAMUSCULAR | Status: DC | PRN

## 2024-06-19 MED ORDER — VITAMIN D 25 MCG (1000 UNIT) PO TABS
1000.0000 [IU] | ORAL_TABLET | Freq: Every day | ORAL | Status: DC
  Administered 2024-06-19 – 2024-06-21 (×3): 1000 [IU] via ORAL
  Filled 2024-06-19 (×3): qty 1

## 2024-06-19 MED ORDER — CHLORHEXIDINE GLUCONATE 0.12 % MT SOLN
OROMUCOSAL | Status: AC
  Administered 2024-06-19: 15 mL via OROMUCOSAL
  Filled 2024-06-19: qty 15

## 2024-06-19 MED ORDER — LIDOCAINE 2% (20 MG/ML) 5 ML SYRINGE
INTRAMUSCULAR | Status: AC
  Filled 2024-06-19: qty 5

## 2024-06-19 MED ORDER — CEFAZOLIN SODIUM-DEXTROSE 2-4 GM/100ML-% IV SOLN
2.0000 g | Freq: Four times a day (QID) | INTRAVENOUS | Status: AC
  Administered 2024-06-19 (×2): 2 g via INTRAVENOUS
  Filled 2024-06-19 (×2): qty 100

## 2024-06-19 MED ORDER — SUGAMMADEX SODIUM 200 MG/2ML IV SOLN
INTRAVENOUS | Status: DC | PRN
  Administered 2024-06-19: 200 mg via INTRAVENOUS

## 2024-06-19 MED ORDER — LACTATED RINGERS IV SOLN: INTRAVENOUS | Status: DC

## 2024-06-19 MED ORDER — ROCURONIUM BROMIDE 10 MG/ML (PF) SYRINGE
PREFILLED_SYRINGE | INTRAVENOUS | Status: AC
  Filled 2024-06-19: qty 10

## 2024-06-19 MED ORDER — ASPIRIN 81 MG PO CHEW
81.0000 mg | CHEWABLE_TABLET | Freq: Two times a day (BID) | ORAL | Status: DC
  Administered 2024-06-19 – 2024-06-21 (×4): 81 mg via ORAL
  Filled 2024-06-19 (×4): qty 1

## 2024-06-19 MED ORDER — PROPOFOL 10 MG/ML IV BOLUS
INTRAVENOUS | Status: DC | PRN
  Administered 2024-06-19: 120 mg via INTRAVENOUS

## 2024-06-19 MED ORDER — DEXAMETHASONE SOD PHOSPHATE PF 10 MG/ML IJ SOLN
INTRAMUSCULAR | Status: DC | PRN
  Administered 2024-06-19: 5 mg via INTRAVENOUS

## 2024-06-19 MED ORDER — ACETAMINOPHEN 650 MG RE SUPP: 650.0000 mg | Freq: Four times a day (QID) | RECTAL | Status: DC

## 2024-06-19 MED ORDER — SCOPOLAMINE 1 MG/3DAYS TD PT72
1.0000 | MEDICATED_PATCH | TRANSDERMAL | Status: DC
  Administered 2024-06-19: 1 mg via TRANSDERMAL

## 2024-06-19 MED ORDER — BISACODYL 5 MG PO TBEC: 5.0000 mg | DELAYED_RELEASE_TABLET | Freq: Every day | ORAL | Status: DC | PRN

## 2024-06-19 MED ORDER — ONDANSETRON HCL 4 MG/2ML IJ SOLN
INTRAMUSCULAR | Status: DC | PRN
  Administered 2024-06-19 (×2): 4 mg via INTRAVENOUS

## 2024-06-19 MED ORDER — HYDROMORPHONE HCL 1 MG/ML IJ SOLN: 0.5000 mg | INTRAMUSCULAR | Status: DC | PRN

## 2024-06-19 MED ORDER — HYDROMORPHONE HCL 1 MG/ML IJ SOLN
0.2500 mg | INTRAMUSCULAR | Status: DC | PRN
  Administered 2024-06-19 (×4): 0.5 mg via INTRAVENOUS

## 2024-06-19 MED ORDER — ROCURONIUM BROMIDE 10 MG/ML (PF) SYRINGE
PREFILLED_SYRINGE | INTRAVENOUS | Status: DC | PRN
  Administered 2024-06-19: 20 mg via INTRAVENOUS
  Administered 2024-06-19: 10 mg via INTRAVENOUS
  Administered 2024-06-19: 40 mg via INTRAVENOUS

## 2024-06-19 MED ORDER — TRANEXAMIC ACID-NACL 1000-0.7 MG/100ML-% IV SOLN
INTRAVENOUS | Status: DC | PRN
  Administered 2024-06-19: 1000 mg via INTRAVENOUS

## 2024-06-19 MED ORDER — ORAL CARE MOUTH RINSE: 15.0000 mL | Freq: Once | OROMUCOSAL | Status: AC

## 2024-06-19 MED ORDER — PROPOFOL 10 MG/ML IV BOLUS
INTRAVENOUS | Status: AC
  Filled 2024-06-19: qty 20

## 2024-06-19 MED ORDER — ACETAMINOPHEN 325 MG PO TABS
650.0000 mg | ORAL_TABLET | Freq: Four times a day (QID) | ORAL | Status: DC
  Administered 2024-06-20 – 2024-06-21 (×2): 650 mg via ORAL
  Filled 2024-06-19 (×3): qty 2

## 2024-06-19 MED ORDER — OXYCODONE HCL 5 MG PO TABS: 5.0000 mg | ORAL_TABLET | Freq: Once | ORAL | Status: DC | PRN

## 2024-06-19 MED ORDER — 0.9 % SODIUM CHLORIDE (POUR BTL) OPTIME
TOPICAL | Status: DC | PRN
  Administered 2024-06-19: 1000 mL

## 2024-06-19 MED ORDER — VANCOMYCIN HCL 1000 MG IV SOLR
INTRAVENOUS | Status: DC | PRN
  Administered 2024-06-19: 1000 mg via TOPICAL

## 2024-06-19 MED ORDER — SODIUM CHLORIDE 0.9 % IV SOLN: INTRAVENOUS | Status: AC

## 2024-06-19 MED ORDER — MENTHOL 3 MG MT LOZG: 1.0000 | LOZENGE | OROMUCOSAL | Status: DC | PRN

## 2024-06-19 MED ORDER — PHENYLEPHRINE 80 MCG/ML (10ML) SYRINGE FOR IV PUSH (FOR BLOOD PRESSURE SUPPORT)
PREFILLED_SYRINGE | INTRAVENOUS | Status: DC | PRN
  Administered 2024-06-19 (×2): 160 ug via INTRAVENOUS
  Administered 2024-06-19: 80 ug via INTRAVENOUS

## 2024-06-19 MED ORDER — PHENOL 1.4 % MT LIQD: 1.0000 | OROMUCOSAL | Status: DC | PRN

## 2024-06-19 MED ORDER — AMISULPRIDE (ANTIEMETIC) 5 MG/2ML IV SOLN: 10.0000 mg | Freq: Once | INTRAVENOUS | Status: DC | PRN

## 2024-06-19 MED ORDER — SODIUM CHLORIDE 0.9 % IR SOLN: Status: DC | PRN

## 2024-06-19 MED ORDER — FENTANYL CITRATE (PF) 250 MCG/5ML IJ SOLN
INTRAMUSCULAR | Status: DC | PRN
  Administered 2024-06-19: 50 ug via INTRAVENOUS

## 2024-06-19 MED ORDER — STERILE WATER FOR IRRIGATION IR SOLN
Status: DC | PRN
  Administered 2024-06-19: 1000 mL

## 2024-06-19 MED ORDER — CHLORHEXIDINE GLUCONATE CLOTH 2 % EX PADS
6.0000 | MEDICATED_PAD | Freq: Every day | CUTANEOUS | Status: DC
  Administered 2024-06-20 – 2024-06-21 (×2): 6 via TOPICAL

## 2024-06-19 MED ORDER — SCOPOLAMINE 1 MG/3DAYS TD PT72
MEDICATED_PATCH | TRANSDERMAL | Status: AC
  Filled 2024-06-19: qty 1

## 2024-06-19 NOTE — Transfer of Care (Signed)
 Immediate Anesthesia Transfer of Care Note  Patient: Ashley Gutierrez  Procedure(s) Performed: ARTHROPLASTY, HIP, TOTAL, ANTERIOR APPROACH (Left: Hip)  Patient Location: PACU  Anesthesia Type:General  Level of Consciousness: drowsy and patient cooperative  Airway & Oxygen Therapy: Patient Spontanous Breathing  Post-op Assessment: Report given to RN and Post -op Vital signs reviewed and stable  Post vital signs: Reviewed and stable  Last Vitals:  Vitals Value Taken Time  BP 158/77 06/19/24 09:53  Temp    Pulse 100 06/19/24 09:55  Resp 18 06/19/24 09:55  SpO2 97 % 06/19/24 09:55  Vitals shown include unfiled device data.  Last Pain:  Vitals:   06/19/24 0657  TempSrc: Oral  PainSc:          Complications: No notable events documented.

## 2024-06-19 NOTE — Progress Notes (Signed)
 Attempted to complete admission, patient requested for me to come back later due to her wanting to rest and stated that she isn't feeling well at this time.

## 2024-06-19 NOTE — Discharge Instructions (Signed)
 Franky Light, MD Lauraine Moores PA-C [facility] [address] [place], [place] [address] [phone] (tel)   [phone] (fax)   TOTAL HIP REPLACEMENT POSTOPERATIVE DIRECTIONS    Hip Rehabilitation, Guidelines Following Surgery   WEIGHT BEARING Weight bearing as tolerated with assist device (walker, cane, etc) as directed, use it as long as suggested by your surgeon or therapist, typically at least 4-6 weeks.  The results of a hip operation are greatly improved after range of motion and muscle strengthening exercises. Follow all safety measures which are given to protect your hip. If any of these exercises cause increased pain or swelling in your joint, decrease the amount until you are comfortable again. Then slowly increase the exercises. Call your caregiver if you have problems or questions.   HOME CARE INSTRUCTIONS  Most of the following instructions are designed to prevent the dislocation of your new hip.  Remove items at home which could result in a fall. This includes throw rugs or furniture in walking pathways.  Continue medications as instructed at time of discharge. You may have some home medications which will be placed on hold until you complete the course of blood thinner medication. You may start showering once you are discharged home. Do not remove your dressing. Do not put on socks or shoes without following the instructions of your caregivers.   Sit on chairs with arms. Use the chair arms to help push yourself up when arising.  Arrange for the use of a toilet seat elevator so you are not sitting low.  Walk with walker as instructed.  You may resume a sexual relationship in one month or when given the OK by your caregiver.  Use walker as long as suggested by your caregivers.  You may put full weight on your legs and walk as much as is comfortable. Avoid periods of inactivity such as sitting longer than an hour when not asleep. This helps  prevent blood clots.  You may return to work once you are cleared by Designer, industrial/product.  Do not drive a car for 6 weeks or until released by your surgeon.  Do not drive while taking narcotics.  Wear elastic stockings for two weeks following surgery during the day but you may remove then at night.  Make sure you keep all of your appointments after your operation with all of your doctors and caregivers. You should call the office at the above phone number and make an appointment for approximately two weeks after the date of your surgery. Please pick up a stool softener and laxative for home use as long as you are requiring pain medications. ICE to the affected hip every three hours for 30 minutes at a time and then as needed for pain and swelling. Continue to use ice on the hip for pain and swelling from surgery. You may notice swelling that will progress down to the foot and ankle.  This is normal after surgery.  Elevate the leg when you are not up walking on it.   It is important for you to complete the blood thinner medication as prescribed by your doctor. Continue to use the breathing machine which will help keep your temperature down.  It is common for your temperature to cycle up and down following surgery, especially at night when you are not up moving around and exerting yourself.  The breathing machine keeps your lungs expanded and your temperature down.  RANGE OF MOTION AND STRENGTHENING EXERCISES  These exercises  are designed to help you keep full movement of your hip joint. Follow your caregiver's or physical therapist's instructions. Perform all exercises about fifteen times, three times per day or as directed. Exercise both hips, even if you have had only one joint replacement. These exercises can be done on a training (exercise) mat, on the floor, on a table or on a bed. Use whatever works the best and is most comfortable for you. Use music or television while you are exercising so that the  exercises are a pleasant break in your day. This will make your life better with the exercises acting as a break in routine you can look forward to.  Lying on your back, slowly slide your foot toward your buttocks, raising your knee up off the floor. Then slowly slide your foot back down until your leg is straight again.  Lying on your back spread your legs as far apart as you can without causing discomfort.  Lying on your side, raise your upper leg and foot straight up from the floor as far as is comfortable. Slowly lower the leg and repeat.  Lying on your back, tighten up the muscle in the front of your thigh (quadriceps muscles). You can do this by keeping your leg straight and trying to raise your heel off the floor. This helps strengthen the largest muscle supporting your knee.  Lying on your back, tighten up the muscles of your buttocks both with the legs straight and with the knee bent at a comfortable angle while keeping your heel on the floor.   SKILLED REHAB INSTRUCTIONS: If the patient is transferred to a skilled rehab facility following release from the hospital, a list of the current medications will be sent to the facility for the patient to continue.  When discharged from the skilled rehab facility, please have the facility set up the patient's Home Health Physical Therapy prior to being released. Also, the skilled facility will be responsible for providing the patient with their medications at time of release from the facility to include their pain medication and their blood thinner medication. If the patient is still at the rehab facility at time of the two week follow up appointment, the skilled rehab facility will also need to assist the patient in arranging follow up appointment in our office and any transportation needs.  POST-OPERATIVE OPIOID TAPER INSTRUCTIONS: It is important to wean off of your opioid medication as soon as possible. If you do not need pain medication after your  surgery it is ok to stop day one. Opioids include: Codeine, Hydrocodone(Norco, Vicodin), Oxycodone(Percocet, oxycontin) and hydromorphone amongst others.  Long term and even short term use of opiods can cause: Increased pain response Dependence Constipation Depression Respiratory depression And more.  Withdrawal symptoms can include Flu like symptoms Nausea, vomiting And more Techniques to manage these symptoms Hydrate well Eat regular healthy meals Stay active Use relaxation techniques(deep breathing, meditating, yoga) Do Not substitute Alcohol to help with tapering If you have been on opioids for less than two weeks and do not have pain than it is ok to stop all together.  Plan to wean off of opioids This plan should start within one week post op of your joint replacement. Maintain the same interval or time between taking each dose and first decrease the dose.  Cut the total daily intake of opioids by one tablet each day Next start to increase the time between doses. The last dose that should be eliminated is the evening  dose.    MAKE SURE YOU:  Understand these instructions.  Will watch your condition.  Will get help right away if you are not doing well or get worse.  Pick up stool softner and laxative for home use following surgery while on pain medications. Do not remove your dressing. The dressing is waterproof--it is OK to take showers. Continue to use ice for pain and swelling after surgery. Do not use any lotions or creams on the incision until instructed by your surgeon. Total Hip Protocol.

## 2024-06-19 NOTE — Anesthesia Postprocedure Evaluation (Signed)
"   Anesthesia Post Note  Patient: Ashley Gutierrez  Procedure(s) Performed: ARTHROPLASTY, HIP, TOTAL, ANTERIOR APPROACH (Left: Hip)     Patient location during evaluation: PACU Anesthesia Type: General Level of consciousness: awake and alert Pain management: pain level controlled Vital Signs Assessment: post-procedure vital signs reviewed and stable Respiratory status: spontaneous breathing, nonlabored ventilation, respiratory function stable and patient connected to nasal cannula oxygen Cardiovascular status: blood pressure returned to baseline and stable Postop Assessment: no apparent nausea or vomiting Anesthetic complications: no   No notable events documented.  Last Vitals:  Vitals:   06/19/24 1030 06/19/24 1101  BP: (!) 141/72 (!) 147/74  Pulse: 91 93  Resp: 18 18  Temp: 36.7 C 36.7 C  SpO2: 98% 91%    Last Pain:  Vitals:   06/19/24 1101  TempSrc:   PainSc: Asleep                 [name]      "

## 2024-06-19 NOTE — Progress Notes (Signed)
 " PROGRESS NOTE    Ashley Gutierrez  FMW:[id number] DOB: 11-Sep-1950 DOA: 06/17/2024 PCP: Larnell Hamilton, MD   Brief Narrative:  Ashley Gutierrez is a 74 y.o. female with medical history significant of osteoporosis, hyperlipidemia presented with mechanical fall at home.She denied any head injury, loss of consciousness, lightheadedness, dizziness prior to the fall.  Patient was unable to ambulate therefore EMS was called.  She is not on any blood thinner.  On arrival to ED, she was fairly hemodynamically stable, x-ray of the left hip showed moderately displaced proximal left femoral neck fracture.  Orthopedics consulted.  Assessment & Plan:   Principal Problem:   Femoral fracture (HCC)  Left femoral neck fracture status post fall at home/history of osteoporosis: X-ray confirms moderately displaced proximal left femoral neck fracture.  Orthopedics consulted.  Status post surgery.  Slightly nauseous and groggy at the moment as she just returned from the surgery.  Son and daughter-in-law at the bedside.  Elevated blood pressure: No prior history of hypertension.  Blood pressure elevated likely secondary to pain but improving.  Continue as needed hydralazine .  Hyperlipidemia: Resumed Crestor   Vitamin D  deficiency: Will start on supplement.  DVT prophylaxis: SCDs Start: 06/17/24 1854   Code Status: Full Code  Family Communication: Son and daughter-in-law present at bedside.  Plan of care discussed with patient in length and he/she verbalized understanding and agreed with it.  Status is: Inpatient Remains inpatient appropriate because: Just underwent hip surgery.   Estimated body mass index is 18.79 kg/m as calculated from the following:   Height as of this encounter: 5' 7 (1.702 m).   Weight as of this encounter: 54.4 kg.    Nutritional Assessment: Body mass index is 18.79 kg/m.Ashley Gutierrez Seen by dietician.  I agree with the assessment and plan as outlined below: Nutrition Status:         . Skin Assessment: I have examined the patient's skin and I agree with the wound assessment as performed by the wound care RN as outlined below:    Consultants:  Orthopedics  Procedures:  As above  Antimicrobials:  Anti-infectives (From admission, onward)    Start     Dose/Rate Route Frequency Ordered Stop   06/19/24 1000  [MAR Hold]  ceFAZolin  (ANCEF ) IVPB 2g/100 mL premix        (MAR Hold since Mon 06/19/2024 at 0649.Hold Reason: Transfer to a Procedural area)   2 g 200 mL/hr over 30 Minutes Intravenous To ShortStay Surgical 06/18/24 1321 06/20/24 1000   06/19/24 0716  vancomycin  (VANCOCIN ) powder          As needed 06/19/24 0716           Subjective: Patient seen and examined after she returned from the surgery.  She was still groggy and waking up from the surgery, was complaining of some nausea.  No other complaint.  Objective: Vitals:   06/18/24 1943 06/19/24 0503 06/19/24 0600 06/19/24 0657  BP: (!) 172/87 (!) 168/104 125/61 (!) 141/70  Pulse: 67 72 69 78  Resp: 16 16  18   Temp: 98.9 F (37.2 C) 98.6 F (37 C) 98.4 F (36.9 C) 98.4 F (36.9 C)  TempSrc: Oral Oral Oral Oral  SpO2: 98% 99% 99% 98%  Weight:    54.4 kg  Height:    5' 7 (1.702 m)    Intake/Output Summary (Last 24 hours) at 06/19/2024 0742 Last data filed at 06/18/2024 1947 Gross per 24 hour  Intake 120 ml  Output 200 ml  Net -80 ml   Filed Weights   06/17/24 1714 06/19/24 0657  Weight: 54.4 kg 54.4 kg    Examination:  General exam: Appears lethargic. Respiratory system: Clear to auscultation. Respiratory effort normal. Cardiovascular system: S1 & S2 heard, RRR. No JVD, murmurs, rubs, gallops or clicks. No pedal edema. Gastrointestinal system: Abdomen is nondistended, soft and nontender. No organomegaly or masses felt. Normal bowel sounds heard. Central nervous system: Lethargic but oriented. Extremities: Left lower extremity shortened and externally rotated Skin: No rashes, lesions or  ulcers   Data Reviewed: I have personally reviewed following labs and imaging studies  CBC: Recent Labs  Lab 06/17/24 1716 06/18/24 0500 06/19/24 0242  WBC 10.3 12.6* 9.3  NEUTROABS  --   --  7.1  HGB 13.9 14.2 15.4*  HCT 42.4 41.3 45.0  MCV 91.8 89.2 90.5  PLT 246 239 226   Basic Metabolic Panel: Recent Labs  Lab 06/17/24 1716 06/18/24 0500 06/19/24 0242  NA 142 140 141  K 3.6 3.9 3.8  CL 104 104 104  CO2 25 26 26   GLUCOSE 132* 110* 101*  BUN 13 9 11   CREATININE 0.70 0.63 0.69  CALCIUM  9.2 9.2 9.2   GFR: Estimated Creatinine Clearance: 53 mL/min (by C-G formula based on SCr of 0.69 mg/dL). Liver Function Tests: No results for input(s): AST, ALT, ALKPHOS, BILITOT, PROT, ALBUMIN in the last 168 hours. No results for input(s): LIPASE, AMYLASE in the last 168 hours. No results for input(s): AMMONIA in the last 168 hours. Coagulation Profile: No results for input(s): INR, PROTIME in the last 168 hours. Cardiac Enzymes: No results for input(s): CKTOTAL, CKMB, CKMBINDEX, TROPONINI in the last 168 hours. BNP (last 3 results) No results for input(s): PROBNP in the last 8760 hours. HbA1C: No results for input(s): HGBA1C in the last 72 hours. CBG: No results for input(s): GLUCAP in the last 168 hours. Lipid Profile: No results for input(s): CHOL, HDL, LDLCALC, TRIG, CHOLHDL, LDLDIRECT in the last 72 hours. Thyroid Function Tests: No results for input(s): TSH, T4TOTAL, FREET4, T3FREE, THYROIDAB in the last 72 hours. Anemia Panel: No results for input(s): VITAMINB12, FOLATE, FERRITIN, TIBC, IRON, RETICCTPCT in the last 72 hours. Sepsis Labs: No results for input(s): PROCALCITON, LATICACIDVEN in the last 168 hours.  Recent Results (from the past 240 hours)  Surgical pcr screen     Status: Abnormal   Collection Time: 06/18/24  6:44 AM   Specimen: Nasal Mucosa; Nasal Swab  Result Value Ref Range  Status   MRSA, PCR NEGATIVE NEGATIVE Final   Staphylococcus aureus POSITIVE (A) NEGATIVE Final    Comment: (NOTE) The Xpert SA Assay (FDA approved for NASAL specimens in patients 66 years of age and older), is one component of a comprehensive surveillance program. It is not intended to diagnose infection nor to guide or monitor treatment. Performed at [facility], [address], [place], [place] [address]      Radiology Studies: DG Knee Left Port Result Date: 06/18/2024 EXAM: 1 or 2 VIEW(S) XRAY OF THE KNEE 06/18/2024 01:58:00 PM COMPARISON: None available. CLINICAL HISTORY: Hip fracture [facility]) [phone] FINDINGS: BONES AND JOINTS: No acute fracture. No malalignment. No significant joint effusion. No significant degenerative changes. SOFT TISSUES: The soft tissues are unremarkable. IMPRESSION: 1. No acute findings. Electronically signed by: Norman Gatlin MD 06/18/2024 02:06 PM EST RP Workstation: [id number]   DG Hip Unilat W or Wo Pelvis 2-3 Views Left Result Date: 06/17/2024 CLINICAL DATA:  Left hip injury after fall  EXAM: DG HIP (WITH OR WITHOUT PELVIS) 2-3V LEFT COMPARISON:  None Available. FINDINGS: Moderately displaced proximal left femoral neck fracture is noted. IMPRESSION: Moderately displaced proximal left femoral neck fracture. Electronically Signed   By: Lynwood Landy Raddle M.D.   On: 06/17/2024 17:48    Scheduled Meds:  [MAR Hold] Chlorhexidine  Gluconate Cloth  6 each Topical Daily   [MAR Hold]  morphine  injection  4 mg Intravenous Once   [MAR Hold] mupirocin  ointment  1 Application Nasal BID   [MAR Hold] ondansetron  (ZOFRAN ) IV  4 mg Intravenous Once   [MAR Hold] rosuvastatin   10 mg Oral q AM   scopolamine   1 patch Transdermal Q72H   scopolamine        Continuous Infusions:  [MAR Hold]  ceFAZolin  (ANCEF ) IV     lactated ringers  10 mL/hr at 06/19/24 0717     LOS: 2 days   Fredia Skeeter, MD [facility]  06/19/2024, 7:42 AM   *Please note that this is a verbal  dictation therefore any spelling or grammatical errors are due to the Dragon Medical One system interpretation.  Please page via Amion and do not message via secure chat for urgent patient care matters. Secure chat can be used for non urgent patient care matters.  How to contact the [facility] Attending or Consulting provider 7A - 7P or covering provider during after hours 7P -7A, for this patient?  Check the care team in [facility] and look for a) attending/consulting [facility] provider listed and b) the [facility] team listed. Page or secure chat 7A-7P. Log into [email or web address] and use [place]'s universal password to access. If you do not have the password, please contact the hospital operator. Locate the [facility] provider you are looking for under [facility] and page to a number that you can be directly reached. If you still have difficulty reaching the provider, please page the [facility] (Director on Call) for the Hospitalists listed on amion for assistance.  "

## 2024-06-19 NOTE — Anesthesia Procedure Notes (Signed)
 Procedure Name: Intubation Date/Time: 06/19/2024 7:38 AM  Performed by: Marva Lonni PARAS, CRNAPre-anesthesia Checklist: Patient identified, Emergency Drugs available, Suction available and Patient being monitored Patient Re-evaluated:Patient Re-evaluated prior to induction Oxygen Delivery Method: Circle System Utilized Preoxygenation: Pre-oxygenation with 100% oxygen Induction Type: IV induction Ventilation: Mask ventilation without difficulty Laryngoscope Size: Mac and 4 Grade View: Grade I Tube type: Oral Tube size: 7.0 mm Number of attempts: 1 Airway Equipment and Method: Stylet Placement Confirmation: ETT inserted through vocal cords under direct vision, positive ETCO2 and breath sounds checked- equal and bilateral Secured at: 21 cm Tube secured with: Tape Dental Injury: Teeth and Oropharynx as per pre-operative assessment

## 2024-06-19 NOTE — Op Note (Signed)
 Orthopaedic Surgery Operative Note (CSN: [id number] ) Date of Surgery: 06/19/2024  Admit Date: 06/17/2024   Diagnoses: Pre-Op Diagnoses: Displaced left femoral neck fracture  Post-Op Diagnosis: Same  Procedures: CPT 27130-Left total hip arthroplasty for femoral neck fracture  Surgeons : Primary: Kendal Franky SQUIBB, MD  Assistant: Lauraine Moores, PA-C  Location: OR 3   Anesthesia: General   Antibiotics: Ancef  2g preop with 1 gm vancomycin  powder placed topically   Tourniquet time: None    Estimated Blood Loss: 150 mL  Complications:None  Specimens:* No specimens in log *   Implants: Implant Name Type Inv. Item Serial No. Manufacturer Lot No. LRB No. Used Action  SHELL ACETAB 54 4H HIP - [id number] Shell SHELL ACETAB 54 4H HIP  [employer](ORTH,TRAU,BIO,SG) [id number] Left 1 Implanted  LINER ACETAB NN G7 F 36 - [id number] Liner LINER ACETAB NN G7 F 36  [employer](ORTH,TRAU,BIO,SG) [id number] Left 1 Implanted  STEM POROUS COATED [id number] - [id number] Stem STEM POROUS COATED [id number]  [employer](ORTH,TRAU,BIO,SG) [id number] Left 1 Implanted  HEAD CERAMIC BIOLOX 36 - [id number] Head HEAD CERAMIC BIOLOX 36  [employer]ON(ORTH,TRAU,BIO,SG) [id number] Left 1 Implanted     Indications for Surgery: 74 year old female who sustained a ground-level fall with a left femoral neck fracture.  Due to her age and mobility level I recommend proceeding with left total hip arthroplasty.  Risks and benefits were discussed with the patient.  Risks included but not limited to bleeding, infection, periprosthetic fracture, hip dislocation, leg length discrepancy, nerve and blood vessel injury, DVT, even the possibility anesthetic complications.  She agreed to proceed with surgery and consent was obtained.  Operative Findings: 1.  Left total hip arthroplasty through anterior approach using [employer] G7 acetabular system 54 mm shell for a size F liner 2.  G7 acetabular system vitamin E highly cross-linked  polyethylene neutral liner for 36 mm head size F 3.  Cementless [employer] Taperloc reduced distal high offset size 11 stem 4.  [employer] Biolox delta modular ceramic head size 36 mm with a -3 mm neck  Procedure: The patient was identified in the preoperative holding area. Consent was confirmed with the patient and their family and all questions were answered. The operative extremity was marked after confirmation with the patient. she was then brought back to the operating room by our anesthesia colleagues.  She was placed under general anesthetic and carefully transferred over to a Hana table.  Fluoroscopic imaging showed the unstable nature of her injury.  The left hip and pelvis were then prepped and draped in usual sterile fashion.  A timeout was performed to verify the patient, the procedure, and the extremity.  Preoperative antibiotics were dosed.  Standard anterior approach to the hip was made and carried down through skin and subcutaneous tissue.  I incised through the lateral portion of the TFL fascia and entered the interval between TFL and sartorius.  I cauterized the crossing vessels and expose the anterior capsule.  I then performed an anterior capsulotomy and released it all the way down to the lesser trochanter.  I then performed an intercalary neck cut and removed the femoral head using a corkscrew.  I measured the head and its size to 48 mm.  I then proceeded to clean out the acetabulum and the acetabular labrum to have adequate exposure.  I then proceeded to sequentially reamed from size 47 up to size 53.  I got good peripheral fit and bleeding cancellous bone and acetabulum.  I then proceeded to  press-fit a size 54 mm cup into the acetabulum getting good fixation without any supplemental screw fixation.  Fluoroscopic imaging showed the acetabular cup in position.  I then placed a neutral vitamin E highly cross-linked polyethylene liner for 36 mm size head.  I then turned my attention  to the femur.  I externally rotated the femur to approximately 130 degrees.  I then delivered the femur into the incision and performed a superior capsular release to allow for appropriate positioning for broaching of the femur.  I used a canal finder to enter the canal.  I then sequentially broached from size 4 to size 11.  Here I got excellent rotational fit and stability of the implant.  I then trialed off of this implant with a 36 mm head -3 neck with a standard offset neck.  The hip reduced relatively easily.  However the leg lengths were nearly symmetric with just a little bit of length on her operative side.  At this point I felt that the size 11 was most appropriate.  So I dislocated the hip and removed the trial implants and placed the size 11 reduced distal stem.  I chose to use a high offset stem to give me some more stability without increasing her leg lengths.  I then proceeded to trial with a -3 neck.  There was a little bit of length but excellent stability and so I decided to use this as a final implant.  The hip was dislocated once more.  I placed the final 36 mm, -3 mm neck for a ceramic head.  The hip was reduced and final fluoroscopic imaging was obtained.  The incision was copiously irrigated.  A gram of vancomycin  powder was placed into the incision.  The capsule was closed with #1 Ethibond.  The fascia was closed with 0 Vicryl, the skin was closed with 2-0 Monocryl and 3 oh Mono.  Dermabond was used to seal the skin.  Aquacel dressing was used to place over the incision.  The patient was then awoke from anesthesia and taken to the PACU in stable condition.  Post Op Plan/Instructions: Patient will be weightbearing as tolerated to the left lower extremity.  She will have no hip precautions.  Will have her mobilize with physical and Occupational Therapy.  She will receive postoperative Ancef .  She will be placed on aspirin  81 mg for DVT prophylaxis.  I was present and performed the entire  surgery.  Lauraine Moores, PA-C did assist me throughout the case. An assistant was necessary given the difficulty in approach, maintenance of reduction and ability to instrument the fracture.   Franky Light, MD [facility]

## 2024-06-19 NOTE — Plan of Care (Signed)
" °  Problem: Education: Goal: Knowledge of General Education information will improve Description: Including pain rating scale, medication(s)/side effects and non-pharmacologic comfort measures Outcome: Progressing   Problem: Clinical Measurements: Goal: Will remain free from infection Outcome: Progressing Goal: Respiratory complications will improve Outcome: Progressing Goal: Cardiovascular complication will be avoided Outcome: Progressing   Problem: Elimination: Goal: Will not experience complications related to bowel motility Outcome: Progressing Goal: Will not experience complications related to urinary retention Outcome: Progressing   Problem: Pain Managment: Goal: General experience of comfort will improve and/or be controlled Outcome: Progressing   Problem: Safety: Goal: Ability to remain free from injury will improve Outcome: Progressing   "

## 2024-06-19 NOTE — Interval H&P Note (Signed)
 History and Physical Interval Note:  06/19/2024 7:24 AM  Ashley Gutierrez  has presented today for surgery, with the diagnosis of LEFT HIP FRACTURE.  The various methods of treatment have been discussed with the patient and family. After consideration of risks, benefits and other options for treatment, the patient has consented to  Procedures: ARTHROPLASTY, HIP, TOTAL, ANTERIOR APPROACH (Left) as a surgical intervention.  The patient's history has been reviewed, patient examined, no change in status, stable for surgery.  I have reviewed the patient's chart and labs.  Questions were answered to the patient's satisfaction.     [name] P Tallie Hevia

## 2024-06-19 NOTE — Evaluation (Signed)
 Occupational Therapy Evaluation Patient Details Name: Ashley Gutierrez MRN: [id number] DOB: Sep 12, 1950 Today's Date: 06/19/2024   History of Present Illness   74 yo F adm 06/17/24 after fall with Left femur fx. 1/5 Lt anterior THA. PMHx: HTN, HLD.     Clinical Impressions PTA Pt reports she was independent with ADLs/IADLs and functional mobility. Pt's spouse is dependent on her and caregiver. Limited eval completed this session as Pt reported increased nausea and pain with movement. Pt demonstrates limited bed mobility, unable to maintain long sitting position without posterior support and unable to roll without Max A. Pt currently requires Max A for ADL tasks. Based on initial encounter, patient would benefit from continued inpatient follow up therapy, <3 hours/day. Hopeful that with continued acute therapies, and nausea and pain management, Pt could progress. OT to continue to follow Pt and monitor d/c recommendation.      If plan is discharge home, recommend the following:   Two people to help with walking and/or transfers;A lot of help with bathing/dressing/bathroom;Assistance with cooking/housework;Assist for transportation;Help with stairs or ramp for entrance     Functional Status Assessment   Patient has had a recent decline in their functional status and demonstrates the ability to make significant improvements in function in a reasonable and predictable amount of time.     Equipment Recommendations   Other (comment) (to be determined)     Recommendations for Other Services         Precautions/Restrictions   Precautions Precautions: Fall Recall of Precautions/Restrictions: Intact Precaution/Restrictions Comments: No hip precautions Restrictions Weight Bearing Restrictions Per Provider Order: Yes LLE Weight Bearing Per Provider Order: Weight bearing as tolerated     Mobility Bed Mobility Overal bed mobility: Needs Assistance             General bed  mobility comments: Pt attempted bed mobility, unable to minimally roll in either direction d/t pain and nausea. In long sitting, Pt attempted to elevate trunk from [facility]. With Max A, Pt unable to maintain long sitting position without posterior support. Pt stated that having her trunk elevated was increasing nausea and denied further mobility attempts.    Transfers                   General transfer comment: Not completed this date as Pt requested to cease mobility d/t nausea.      Balance Overall balance assessment: Needs assistance Sitting-balance support: Bilateral upper extremity supported, Feet supported Sitting balance-Leahy Scale: Poor Sitting balance - Comments: Pt required posterior support to maintain long sitting balance                                   ADL either performed or assessed with clinical judgement   ADL Overall ADL's : Needs assistance/impaired Eating/Feeding: Set up;Bed level Eating/Feeding Details (indicate cue type and reason): HOB elevated Grooming: Minimal assistance;Bed level Grooming Details (indicate cue type and reason): HOB elevated Upper Body Bathing: Moderate assistance   Lower Body Bathing: Maximal assistance   Upper Body Dressing : Moderate assistance   Lower Body Dressing: Maximal assistance     Toilet Transfer Details (indicate cue type and reason): Would require +2 assist and bed pan based on current presentation Toileting- Clothing Manipulation and Hygiene: Total assistance               Vision Patient Visual Report: No change from baseline;Other (comment) (Pt endorses difficulties  following cataract surgery, slight blurry vision but Pt endorses functional/normal limits)       Perception         Praxis         Pertinent Vitals/Pain Pain Assessment Pain Assessment: 0-10 Pain Score: 6  Pain Location: L hip; nausea Pain Descriptors / Indicators: Discomfort Pain Intervention(s): Limited activity within  patient's tolerance, Monitored during session, Repositioned, Patient requesting pain meds-RN notified     Extremity/Trunk Assessment Upper Extremity Assessment Upper Extremity Assessment: Generalized weakness   Lower Extremity Assessment Lower Extremity Assessment: Defer to PT evaluation (Pt unable to move LLE off bed this date without total A from therapist. RLE appears [facility].)       Communication Communication Communication: No apparent difficulties   Cognition Arousal: Lethargic Behavior During Therapy: Flat affect Cognition: No apparent impairments             OT - Cognition Comments: Lethargic this session, increased nausea with attempted mobility.                 Following commands: Intact       Cueing  General Comments   Cueing Techniques: Verbal cues  VSS on RA. Pt educated on importance of early mobility. 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   Exercises     Shoulder Instructions      Home Living Family/patient expects to be discharged to:: Private residence Living Arrangements: Spouse/significant other (husband immobile) Available Help at Discharge: Family;Available PRN/intermittently (son can help PRN) Type of Home: House Home Access: Stairs to enter [employer] of Steps: 3 or 4 Entrance Stairs-Rails: Left Home Layout: One level     Bathroom Shower/Tub: Door   [employer] Toilet: Handicapped height     Home Equipment: Agricultural Consultant (2 wheels);Cane - quad;BSC/3in1;Shower seat;Grab bars - toilet;Grab bars - tub/shower;Hand held shower head   Additional Comments: Husband is dependent on Pt and caregiver      Prior Functioning/Environment Prior Level of Function : Independent/Modified Independent             Mobility Comments: Independent ADLs Comments: Independent, 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    OT Problem List: Decreased strength;Decreased activity tolerance;Impaired balance (sitting and/or  standing);Decreased safety awareness;Decreased knowledge of use of DME or AE;Decreased knowledge of precautions;Pain   OT Treatment/Interventions: Self-care/ADL training;Therapeutic exercise;Energy conservation;DME and/or AE instruction;Therapeutic activities;Patient/family education;Balance training      OT Goals(Current goals can be found in the care plan section)   Acute Rehab OT Goals Patient Stated Goal: to decrease nasuea OT Goal Formulation: With patient Time For Goal Achievement: 07/03/24 Potential to Achieve Goals: Good ADL Goals Pt Will Perform Lower Body Bathing: with min assist;with adaptive equipment Pt Will Perform Lower Body Dressing: with min assist;with adaptive equipment Pt/caregiver will Perform Home Exercise Program: Increased strength;Both right and left upper extremity;With written HEP provided Additional ADL Goal #1: Pt will engage in bed mobility with Min A to come to EOB as a precursor to ADL tasks OOB.   OT Frequency:  Min 2X/week    Co-evaluation              AM-PAC OT 6 Clicks Daily Activity     Outcome Measure Help from another person eating meals?: A Little Help from another person taking care of personal grooming?: A Little Help from another person toileting, which includes using toliet, bedpan, or urinal?: Total Help from another person bathing (including washing, rinsing, drying)?: A Lot Help from another person to  put on and taking off regular upper body clothing?: A Lot Help from another person to put on and taking off regular lower body clothing?: A Lot 6 Click Score: 13   End of Session Nurse Communication: Patient requests pain meds  Activity Tolerance: Patient limited by pain;Other (comment) (nausea) Patient left: in bed;with call bell/phone within reach;with bed alarm set  OT Visit Diagnosis: Muscle weakness (generalized) (M62.81);History of falling (Z91.81);Pain                Time: 8458-8442 OT Time Calculation (min): 16  min Charges:  OT General Charges $OT Visit: 1 Visit OT Evaluation $OT Eval Low Complexity: 1 Low  Maurilio CROME, OTR/L.  [facility] Acute Rehabilitation  Office: [phone]   Maurilio PARAS Eliah Marquard 06/19/2024, 4:39 PM

## 2024-06-19 NOTE — TOC CAGE-AID Note (Signed)
 Transition of Care [facility]) - CAGE-AID Screening   Patient Details  Name: BAYYINAH DUKEMAN MRN: [id number] Date of Birth: Sep 06, 1950  [facility]) CM/SW Contact:    Natalyia Innes E Tyese Finken, LCSW Phone Number: 06/19/2024, 9:31 AM   Clinical Narrative: No SA noted.   CAGE-AID Screening:    Have You Ever Felt You Ought to Cut Down on Your Drinking or Drug Use?: No Have People Annoyed You By Critizing Your Drinking Or Drug Use?: No Have You Felt Bad Or Guilty About Your Drinking Or Drug Use?: No Have You Ever Had a Drink or Used Drugs First Thing In The Morning to Steady Your Nerves or to Get Rid of a Hangover?: No CAGE-AID Score: 0  Substance Abuse Education Offered: No

## 2024-06-20 ENCOUNTER — Encounter (HOSPITAL_COMMUNITY): Payer: Self-pay | Admitting: Student

## 2024-06-20 DIAGNOSIS — S72042A Displaced fracture of base of neck of left femur, initial encounter for closed fracture: Secondary | ICD-10-CM | POA: Diagnosis not present

## 2024-06-20 LAB — BASIC METABOLIC PANEL WITH GFR
Anion gap: 9 (ref 5–15)
BUN: 20 mg/dL (ref 8–23)
CO2: 26 mmol/L (ref 22–32)
Calcium: 9.1 mg/dL (ref 8.9–10.3)
Chloride: 104 mmol/L (ref 98–111)
Creatinine, Ser: 0.75 mg/dL (ref 0.44–1.00)
GFR, Estimated: >60 mL/min
Glucose, Bld: 113 mg/dL — ABNORMAL HIGH (ref 70–99)
Potassium: 4 mmol/L (ref 3.5–5.1)
Sodium: 139 mmol/L (ref 135–145)

## 2024-06-20 LAB — CBC
HCT: 35.9 % — ABNORMAL LOW (ref 36.0–46.0)
Hemoglobin: 12.5 g/dL (ref 12.0–15.0)
MCH: 30.9 pg (ref 26.0–34.0)
MCHC: 34.8 g/dL (ref 30.0–36.0)
MCV: 88.6 fL (ref 80.0–100.0)
Platelets: 163 K/uL (ref 150–400)
RBC: 4.05 MIL/uL (ref 3.87–5.11)
RDW: 13.3 % (ref 11.5–15.5)
WBC: 15.1 K/uL — ABNORMAL HIGH (ref 4.0–10.5)
nRBC: 0 % (ref 0.0–0.2)

## 2024-06-20 NOTE — Progress Notes (Signed)
 " PROGRESS NOTE    Ashley Gutierrez  FMW:[id number] DOB: May 29, 1951 DOA: 06/17/2024 PCP: Larnell Hamilton, MD   Brief Narrative:  Ashley Gutierrez is a 74 y.o. female with medical history significant of osteoporosis, hyperlipidemia presented with mechanical fall at home.She denied any head injury, loss of consciousness, lightheadedness, dizziness prior to the fall.  Patient was unable to ambulate therefore EMS was called.  She is not on any blood thinner.  On arrival to ED, she was fairly hemodynamically stable, x-ray of the left hip showed moderately displaced proximal left femoral neck fracture.  Orthopedics consulted.  Assessment & Plan:   Principal Problem:   Femoral fracture (HCC)  Left femoral neck fracture status post fall at home/history of osteoporosis: X-ray confirms moderately displaced proximal left femoral neck fracture.  Orthopedics consulted.  Status post THA 06/20/2023.  Also hemoglobin dropped from 14.2 preoperatively to 12.5 postoperatively, possibly may have further drop tomorrow.  Pending evaluation by PT OT.  Management per orthopedics.  Elevated blood pressure: No prior history of hypertension.  Blood pressure was elevated initially likely secondary to pain but improving.  Blood pressure now much better controlled and within normal range.  Continue as needed hydralazine .  Hyperlipidemia: Continue Crestor   Vitamin D  deficiency: Continue vitamin D  supplement.  DVT prophylaxis: SCDs Start: 06/19/24 1102 Place TED hose Start: 06/19/24 1102 SCDs Start: 06/17/24 1854   Code Status: Full Code  Family Communication: None present at bedside.  Plan of care discussed with patient in length and he/she verbalized understanding and agreed with it.  Status is: Inpatient Remains inpatient appropriate because: Needs to be seen by PT OT.   Estimated body mass index is 18.79 kg/m as calculated from the following:   Height as of this encounter: 5' 7 (1.702 m).   Weight as of this  encounter: 54.4 kg.    Nutritional Assessment: Body mass index is 18.79 kg/m.Ashley Gutierrez Seen by dietician.  I agree with the assessment and plan as outlined below: Nutrition Status:        . Skin Assessment: I have examined the patient's skin and I agree with the wound assessment as performed by the wound care RN as outlined below:    Consultants:  Orthopedics  Procedures:  As above  Antimicrobials:  Anti-infectives (From admission, onward)    Start     Dose/Rate Route Frequency Ordered Stop   06/19/24 1400  ceFAZolin  (ANCEF ) IVPB 2g/100 mL premix        2 g 200 mL/hr over 30 Minutes Intravenous Every 6 hours 06/19/24 1101 06/19/24 2032   06/19/24 1000  ceFAZolin  (ANCEF ) IVPB 2g/100 mL premix        2 g 200 mL/hr over 30 Minutes Intravenous To [facility] 06/18/24 1321 06/19/24 0755   06/19/24 0716  vancomycin  (VANCOCIN ) powder  Status:  Discontinued          As needed 06/19/24 0716 06/19/24 0948         Subjective: Patient seen and examined.  Nurse at the bedside.  Patient sitting in the recliner.  She is doing well without any complaint.  Pain is controlled.  Objective: Vitals:   06/19/24 1421 06/19/24 2017 06/20/24 0529 06/20/24 0717  BP: (!) 140/77 127/68 133/74 137/76  Pulse: 99 81 93 77  Resp: 18 17 16 16   Temp: 97.6 F (36.4 C) 98 F (36.7 C) 98.5 F (36.9 C) 98.1 F (36.7 C)  TempSrc: Oral Oral Oral Oral  SpO2: 95% 98% 99% 98%  Weight:  Height:        Intake/Output Summary (Last 24 hours) at 06/20/2024 0812 Last data filed at 06/19/2024 1800 Gross per 24 hour  Intake 1274.99 ml  Output 150 ml  Net 1124.99 ml   Filed Weights   06/17/24 1714 06/19/24 0657  Weight: 54.4 kg 54.4 kg    Examination:  General exam: Appears calm and comfortable  Respiratory system: Clear to auscultation. Respiratory effort normal. Cardiovascular system: S1 & S2 heard, RRR. No JVD, murmurs, rubs, gallops or clicks. No pedal edema. Gastrointestinal system:  Abdomen is nondistended, soft and nontender. No organomegaly or masses felt. Normal bowel sounds heard. Central nervous system: Alert and oriented. No focal neurological deficits. Skin: No rashes, lesions or ulcers.  Psychiatry: Judgement and insight appear normal. Mood & affect appropriate.   Data Reviewed: I have personally reviewed following labs and imaging studies  CBC: Recent Labs  Lab 06/17/24 1716 06/18/24 0500 06/19/24 0242 06/20/24 0607  WBC 10.3 12.6* 9.3 15.1*  NEUTROABS  --   --  7.1  --   HGB 13.9 14.2 15.4* 12.5  HCT 42.4 41.3 45.0 35.9*  MCV 91.8 89.2 90.5 88.6  PLT 246 239 226 163   Basic Metabolic Panel: Recent Labs  Lab 06/17/24 1716 06/18/24 0500 06/19/24 0242 06/20/24 0607  NA 142 140 141 139  K 3.6 3.9 3.8 4.0  CL 104 104 104 104  CO2 25 26 26 26   GLUCOSE 132* 110* 101* 113*  BUN 13 9 11 20   CREATININE 0.70 0.63 0.69 0.75  CALCIUM  9.2 9.2 9.2 9.1   GFR: Estimated Creatinine Clearance: 53 mL/min (by C-G formula based on SCr of 0.75 mg/dL). Liver Function Tests: No results for input(s): AST, ALT, ALKPHOS, BILITOT, PROT, ALBUMIN in the last 168 hours. No results for input(s): LIPASE, AMYLASE in the last 168 hours. No results for input(s): AMMONIA in the last 168 hours. Coagulation Profile: No results for input(s): INR, PROTIME in the last 168 hours. Cardiac Enzymes: No results for input(s): CKTOTAL, CKMB, CKMBINDEX, TROPONINI in the last 168 hours. BNP (last 3 results) No results for input(s): PROBNP in the last 8760 hours. HbA1C: No results for input(s): HGBA1C in the last 72 hours. CBG: No results for input(s): GLUCAP in the last 168 hours. Lipid Profile: No results for input(s): CHOL, HDL, LDLCALC, TRIG, CHOLHDL, LDLDIRECT in the last 72 hours. Thyroid Function Tests: No results for input(s): TSH, T4TOTAL, FREET4, T3FREE, THYROIDAB in the last 72 hours. Anemia Panel: No results  for input(s): VITAMINB12, FOLATE, FERRITIN, TIBC, IRON, RETICCTPCT in the last 72 hours. Sepsis Labs: No results for input(s): PROCALCITON, LATICACIDVEN in the last 168 hours.  Recent Results (from the past 240 hours)  Surgical pcr screen     Status: Abnormal   Collection Time: 06/18/24  6:44 AM   Specimen: Nasal Mucosa; Nasal Swab  Result Value Ref Range Status   MRSA, PCR NEGATIVE NEGATIVE Final   Staphylococcus aureus POSITIVE (A) NEGATIVE Final    Comment: (NOTE) The Xpert SA Assay (FDA approved for NASAL specimens in patients 40 years of age and older), is one component of a comprehensive surveillance program. It is not intended to diagnose infection nor to guide or monitor treatment. Performed at [facility], [address], [place], [place] [address]      Radiology Studies: DG HIP UNILAT W OR W/O PELVIS 2-3 VIEWS LEFT Result Date: 06/19/2024 CLINICAL DATA:  Status post left hip arthroplasty. EXAM: DG HIP (WITH OR WITHOUT PELVIS)  2-3V LEFT COMPARISON:  C-arm images obtained earlier today. FINDINGS: Left bipolar hip prosthesis in satisfactory position and alignment. No fracture or dislocation seen. IMPRESSION: Satisfactory postoperative appearance of a left hip prosthesis. Electronically Signed   By: Elspeth Bathe M.D.   On: 06/19/2024 18:14   DG HIP UNILAT WITH PELVIS 1V LEFT Result Date: 06/19/2024 CLINICAL DATA:  Left hip arthroplasty for left femoral neck fracture. EXAM: DG HIP (WITH OR WITHOUT PELVIS) 1V*L* COMPARISON:  06/17/2024 FINDINGS: Eight C-arm images of the left hip demonstrate the previously noted left femoral neck fracture with subsequent placement of a bipolar hip prosthesis in satisfactory position and alignment. No post placement fracture or dislocation seen. IMPRESSION: Left hip arthroplasty in satisfactory position and alignment. Electronically Signed   By: Elspeth Bathe M.D.   On: 06/19/2024 18:13   DG C-Arm 1-60 Min-No Report Result Date:  06/19/2024 Fluoroscopy was utilized by the requesting physician.  No radiographic interpretation.   DG C-Arm 1-60 Min-No Report Result Date: 06/19/2024 Fluoroscopy was utilized by the requesting physician.  No radiographic interpretation.   DG Knee Left Port Result Date: 06/18/2024 EXAM: 1 or 2 VIEW(S) XRAY OF THE KNEE 06/18/2024 01:58:00 PM COMPARISON: None available. CLINICAL HISTORY: Hip fracture [facility]) [phone] FINDINGS: BONES AND JOINTS: No acute fracture. No malalignment. No significant joint effusion. No significant degenerative changes. SOFT TISSUES: The soft tissues are unremarkable. IMPRESSION: 1. No acute findings. Electronically signed by: Norman Gatlin MD 06/18/2024 02:06 PM EST RP Workstation: [id number]    Scheduled Meds:  acetaminophen   650 mg Oral Q6H   Or   acetaminophen   650 mg Rectal Q6H   aspirin   81 mg Oral BID   Chlorhexidine  Gluconate Cloth  6 each Topical Daily   cholecalciferol   1,000 Units Oral Daily   docusate sodium   100 mg Oral BID   mupirocin  ointment  1 Application Nasal BID   rosuvastatin   10 mg Oral q AM   scopolamine   1 patch Transdermal Q72H   Continuous Infusions:  sodium chloride  40 mL/hr at 06/19/24 1257     LOS: 3 days   Fredia Skeeter, MD [facility]  06/20/2024, 8:12 AM   *Please note that this is a verbal dictation therefore any spelling or grammatical errors are due to the Dragon Medical One system interpretation.  Please page via Amion and do not message via secure chat for urgent patient care matters. Secure chat can be used for non urgent patient care matters.  How to contact the [facility] Attending or Consulting provider 7A - 7P or covering provider during after hours 7P -7A, for this patient?  Check the care team in [facility] and look for a) attending/consulting TR[facility] provider listed and b) the TR[facility] team listed. Page or secure chat 7A-7P. Log into [email or web address] and use [place]'s universal password to access. If you do not have the password,  please contact the hospital operator. Locate the [facility] provider you are looking for under [facility] and page to a number that you can be directly reached. If you still have difficulty reaching the provider, please page the [facility] (Director on Call) for the Hospitalists listed on amion for assistance.  "

## 2024-06-20 NOTE — TOC Initial Note (Addendum)
 Transition of Care [facility]) - Initial/Assessment Note    Patient Details  Name: Ashley Gutierrez MRN: [id number] Date of Birth: 13-Jul-1950  Transition of [facility]) CM/SW Contact:    Bridget Cordella Simmonds, LCSW Phone Number: 06/20/2024, 3:45 PM  Clinical Narrative:    CSW spoke with pt and her friend Devere regarding PT recommendation for SNF.  Pt indicates she would like Devere to stay for DC planning discussion.  Pt from home with husband, who has had several strokes and is requiring significant care currently.  No current home services for the pt.  Pt is agreeable to SNF and requests [facility], permission given to send out referral in the hub.  Pt and friend asking some questions about potential hospice care related to her husband.  CSW reached out to Melissa/[facility] and she can contact pt by phone.   1535: [facility] does offer, pt informed and accepts.  SNF auth request submitted in [place] and approved: [id number], 3 days: 1/7-1/9.               Expected Discharge Plan: Skilled Nursing Facility Barriers to Discharge: Continued Medical Work up, SNF Pending bed offer   Patient Goals and CMS Choice Patient states their goals for this hospitalization and ongoing recovery are:: back to normal   Choice offered to / list presented to : Patient (pt requesting [facility])      Expected Discharge Plan and Services In-house Referral: Clinical Social Work   Post Acute Care Choice: Skilled Nursing Facility Living arrangements for the past 2 months: Single Family Home                                      Prior Living Arrangements/Services Living arrangements for the past 2 months: Single Family Home Lives with:: Spouse Patient language and need for interpreter reviewed:: Yes Do you feel safe going back to the place where you live?: Yes      Need for Family Participation in Patient Care: Yes (Comment) Care giver support system in place?: Yes (comment) Current home services: Other (comment)  (none) Criminal Activity/Legal Involvement Pertinent to Current Situation/Hospitalization: No - Comment as needed  Activities of Daily Living      Permission Sought/Granted Permission sought to share information with : Family Supports Permission granted to share information with : Yes, Verbal Permission Granted  Share Information with NAME: son Norleen  Permission granted to share info w AGENCY: SNF        Emotional Assessment Appearance:: Appears stated age Attitude/Demeanor/Rapport: Engaged Affect (typically observed): Pleasant, Appropriate Orientation: : Oriented to Self, Oriented to Place, Oriented to  Time, Oriented to Situation      Admission diagnosis:  Femoral fracture (HCC) [S72.90XA] Left displaced femoral neck fracture (HCC) [S72.002A] Patient Active Problem List   Diagnosis Date Noted   Femoral fracture (HCC) 06/17/2024   Stroke determined by clinical assessment (HCC) 02/05/2022   White coat syndrome with high blood pressure but without hypertension 09/12/2018   Health education/counseling 09/12/2018   HTN (hypertension) with goal to be determined 04/14/2018   Refuses treatment- BP and Chol meds 04/14/2018   Hyperlipidemia 04/14/2018   Noncompliance 04/14/2018   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 [place] [address] Phone: [phone] Fax: [phone]  [id number] [place] [place] - [address] AT [facility] [address] [place] [address] Phone: [phone] Fax: [phone]  [facility] [place], [place] - [address] [place] [address] Phone: [phone] Fax:  [phone]     Social Drivers of Health (SDOH) Social History: SDOH Screenings   Food Insecurity: No Food Insecurity (06/18/2024)  Housing: Low Risk (06/18/2024)  Transportation Needs: No Transportation Needs (06/18/2024)  Utilities: Not At Risk (06/18/2024)  Social Connections: Socially Isolated (06/18/2024)  Tobacco Use: Low Risk (06/19/2024)   SDOH Interventions:     Readmission Risk Interventions     No data to display

## 2024-06-20 NOTE — Plan of Care (Signed)
" °  Problem: Education: Goal: Knowledge of General Education information will improve Description: Including pain rating scale, medication(s)/side effects and non-pharmacologic comfort measures Outcome: Progressing   Problem: Health Behavior/Discharge Planning: Goal: Ability to manage health-related needs will improve Outcome: Progressing   Problem: Clinical Measurements: Goal: Ability to maintain clinical measurements within normal limits will improve Outcome: Progressing Goal: Will remain free from infection Outcome: Progressing Goal: Diagnostic test results will improve Outcome: Progressing Goal: Respiratory complications will improve Outcome: Progressing Goal: Cardiovascular complication will be avoided Outcome: Progressing   Problem: Activity: Goal: Risk for activity intolerance will decrease Outcome: Progressing   Problem: Nutrition: Goal: Adequate nutrition will be maintained Outcome: Progressing   Problem: Coping: Goal: Level of anxiety will decrease Outcome: Progressing   Problem: Elimination: Goal: Will not experience complications related to bowel motility Outcome: Progressing Goal: Will not experience complications related to urinary retention Outcome: Progressing   Problem: Pain Managment: Goal: General experience of comfort will improve and/or be controlled Outcome: Progressing   Problem: Safety: Goal: Ability to remain free from injury will improve Outcome: Progressing   Problem: Skin Integrity: Goal: Risk for impaired skin integrity will decrease Outcome: Progressing   Problem: Education: Goal: Knowledge of the prescribed therapeutic regimen will improve Outcome: Progressing   Problem: Bowel/Gastric: Goal: Gastrointestinal status for postoperative course will improve Outcome: Progressing   Problem: Cardiac: Goal: Ability to maintain an adequate cardiac output Outcome: Progressing Goal: Will show no evidence of cardiac arrhythmias Outcome:  Progressing   Problem: Nutritional: Goal: Will attain and maintain optimal nutritional status Outcome: Progressing   Problem: Neurological: Goal: Will regain or maintain usual level of consciousness Outcome: Progressing   Problem: Clinical Measurements: Goal: Ability to maintain clinical measurements within normal limits Outcome: Progressing Goal: Postoperative complications will be avoided or minimized Outcome: Progressing   Problem: Respiratory: Goal: Will regain and/or maintain adequate ventilation Outcome: Progressing Goal: Respiratory status will improve Outcome: Progressing   Problem: Skin Integrity: Goal: Demonstrates signs of wound healing without infection Outcome: Progressing   Problem: Urinary Elimination: Goal: Will remain free from infection Outcome: Progressing Goal: Ability to achieve and maintain adequate urine output Outcome: Progressing   Problem: Education: Goal: Knowledge of the prescribed therapeutic regimen will improve Outcome: Progressing Goal: Understanding of discharge needs will improve Outcome: Progressing Goal: Individualized Educational Video(s) Outcome: Progressing   Problem: Activity: Goal: Ability to avoid complications of mobility impairment will improve Outcome: Progressing Goal: Ability to tolerate increased activity will improve Outcome: Progressing   Problem: Clinical Measurements: Goal: Postoperative complications will be avoided or minimized Outcome: Progressing   Problem: Pain Management: Goal: Pain level will decrease with appropriate interventions Outcome: Progressing   Problem: Skin Integrity: Goal: Will show signs of wound healing Outcome: Progressing   "

## 2024-06-20 NOTE — Evaluation (Addendum)
 Physical Therapy Evaluation Patient Details Name: Ashley Gutierrez MRN: [id number] DOB: [birth year] Today's Date: 06/20/2024  History of Present Illness  74 yo F adm 06/17/24 after fall with Left femoral fx. 1/5 Lt anterior THA. PMHx: HTN, HLD.  Clinical Impression  PTA, pt lives at home with her spouse and is independent; she is the primary caregiver for her husband who is wheelchair bound. Pt presents with decreased functional mobility secondary to left lower extremity weakness, pain, impaired standing balance, gait abnormalities. Pt requiring min-modA (+2 safety) for transfers and ambulating ~8 ft with RW. Pt requesting to use BSC and brought up behind pt with successful voiding. Subsequently transferred to recliner. Overall, she is mobilizing fairly well POD #1 and suspect steady progress. However, pt cannot verbalize adequate caregiver supports at this point, reporting her son has a family and a job, and is unsure of level of assist he can provide. Currently due to decreased caregiver support, recommending continued inpatient follow up therapy, <3 hours/day. However, pt has potential to progress home if a concrete d/c plan arises and she continues to increase mobilization as expected.         If plan is discharge home, recommend the following: A little help with walking and/or transfers;A little help with bathing/dressing/bathroom;Assistance with cooking/housework;Assist for transportation;Help with stairs or ramp for entrance   Can travel by private vehicle   Yes    Equipment Recommendations Rolling walker (2 wheels);BSC/3in1  Recommendations for Other Services       Functional Status Assessment Patient has had a recent decline in their functional status and demonstrates the ability to make significant improvements in function in a reasonable and predictable amount of time.     Precautions / Restrictions Precautions Precautions: Fall Recall of Precautions/Restrictions:  Intact Precaution/Restrictions Comments: No hip precautions Restrictions Weight Bearing Restrictions Per Provider Order: Yes LLE Weight Bearing Per Provider Order: Weight bearing as tolerated      Mobility  Bed Mobility Overal bed mobility: Needs Assistance Bed Mobility: Supine to Sit     Supine to sit: Mod assist     General bed mobility comments: Assist for LLE management, use of bed pad to initiate movement, cues for pt to use arms to scoot hips out to edge of bed    Transfers Overall transfer level: Needs assistance Equipment used: Rolling walker (2 wheels) Transfers: Sit to/from Stand Sit to Stand: Min assist, +2 safety/equipment           General transfer comment: Assist to rise, verbal cues for hand placement    Ambulation/Gait Ambulation/Gait assistance: Min assist, +2 safety/equipment Gait Distance (Feet): 8 Feet Assistive device: Rolling walker (2 wheels) Gait Pattern/deviations: Step-to pattern, Decreased stance time - right, Decreased weight shift to right Gait velocity: decreased Gait velocity interpretation: <1.31 ft/sec, indicative of household ambulator   General Gait Details: Verbal/visual cues for walker use, sequencing and technique  Stairs            Wheelchair Mobility     Tilt Bed    Modified Rankin (Stroke Patients Only)       Balance Overall balance assessment: Needs assistance Sitting-balance support: Feet supported Sitting balance-Leahy Scale: Fair     Standing balance support: Bilateral upper extremity supported, Reliant on assistive device for balance Standing balance-Leahy Scale: Poor                               Pertinent Vitals/Pain Pain Assessment Pain  Assessment: Faces Faces Pain Scale: Hurts little more Pain Location: L hip; nausea Pain Descriptors / Indicators: Discomfort Pain Intervention(s): Limited activity within patient's tolerance, Monitored during session, Premedicated before session     Home Living Family/patient expects to be discharged to:: Private residence Living Arrangements: Spouse/significant other (husband immobile) Available Help at Discharge: Family;Available PRN/intermittently (son can help PRN) Type of Home: House Home Access: Stairs to enter Entrance Stairs-Rails: Left Entrance Stairs-Number of Steps: 3 or 4   Home Layout: One level Home Equipment: Agricultural Consultant (2 wheels);Cane - quad;BSC/3in1;Shower seat;Grab bars - toilet;Grab bars - tub/shower;Hand held shower head Additional Comments: Husband is dependent on Pt and caregiver    Prior Function Prior Level of Function : Independent/Modified Independent             Mobility Comments: Independent ADLs Comments: Independent, 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     Extremity/Trunk Assessment   Upper Extremity Assessment Upper Extremity Assessment: Defer to OT evaluation    Lower Extremity Assessment Lower Extremity Assessment: LLE deficits/detail LLE Deficits / Details: S/p THA. Able to perform quad set, unable to perform heel slide    Cervical / Trunk Assessment Cervical / Trunk Assessment: Normal  Communication   Communication Communication: No apparent difficulties    Cognition Arousal: Alert Behavior During Therapy: Flat affect   PT - Cognitive impairments: No family/caregiver present to determine baseline                       PT - Cognition Comments: Flat affect, slow to respond, and participate in d/c planning Following commands: Intact       Cueing Cueing Techniques: Verbal cues     General Comments General comments (skin integrity, edema, etc.): BP 139/67 (88)    Exercises General Exercises - Lower Extremity Ankle Circles/Pumps: AROM, Both, 5 reps, Supine   Assessment/Plan    PT Assessment Patient needs continued PT services  PT Problem List Decreased strength;Decreased balance;Decreased activity tolerance;Decreased mobility;Pain       PT Treatment  Interventions DME instruction;Gait training;Stair training;Functional mobility training;Therapeutic activities;Therapeutic exercise;Balance training;Patient/family education    PT Goals (Current goals can be found in the Care Plan section)  Acute Rehab PT Goals Patient Stated Goal: did not state PT Goal Formulation: With patient Time For Goal Achievement: 07/04/24 Potential to Achieve Goals: Good    Frequency Min 3X/week     Co-evaluation   Reason for Co-Treatment: Necessary to address cognition/behavior during functional activity;For patient/therapist safety;To address functional/ADL transfers   OT goals addressed during session: ADL's and self-care;Proper use of Adaptive equipment and DME       AM-PAC PT 6 Clicks Mobility  Outcome Measure Help needed turning from your back to your side while in a flat bed without using bedrails?: A Little Help needed moving from lying on your back to sitting on the side of a flat bed without using bedrails?: A Lot Help needed moving to and from a bed to a chair (including a wheelchair)?: A Little Help needed standing up from a chair using your arms (e.g., wheelchair or bedside chair)?: A Little Help needed to walk in hospital room?: A Little Help needed climbing 3-5 steps with a railing? : Total 6 Click Score: 15    End of Session Equipment Utilized During Treatment: Gait belt Activity Tolerance: Patient tolerated treatment well Patient left: in chair;with call bell/phone within reach;with chair alarm set Nurse Communication: Mobility status;Other (comment) (d/c purewick) PT Visit Diagnosis: Pain;Difficulty in walking, not elsewhere classified (  R26.2);Unsteadiness on feet (R26.81) Pain - Right/Left: Left Pain - part of body: Hip    Time: 9155-9084 PT Time Calculation (min) (ACUTE ONLY): 31 min   Charges:   PT Evaluation $PT Eval Low Complexity: 1 Low   PT General Charges $$ ACUTE PT VISIT: 1 Visit         Ashley Gutierrez, PT,  DPT Acute Rehabilitation Services Office [phone]   Ashley Gutierrez 06/20/2024, 12:07 PM

## 2024-06-20 NOTE — [place] FL2 (Signed)
 " [place]  MEDICAID FL2 LEVEL OF CARE FORM     IDENTIFICATION  Patient Name: Ashley Gutierrez Birthdate: [birth year] Sex: female Admission Date (Current Location): 06/17/2024  [facility] and [place] Number:  Producer, Television/film/video and Address:  [facility], [address] [place], [place] [address]      Provider Number: [id number]  Attending Physician Name and Address:  Vernon Ranks, MD  Relative Name and Phone Number:  Sonda, Coppens   [phone]    Current Level of Care: Hospital Recommended Level of Care: Skilled Nursing Facility Prior Approval Number:    Date Approved/Denied:   PASRR Number: [id number]  Discharge Plan: SNF    Current Diagnoses: Patient Active Problem List   Diagnosis Date Noted   Femoral fracture (HCC) 06/17/2024   Stroke determined by clinical assessment (HCC) 02/05/2022   White coat syndrome with high blood pressure but without hypertension 09/12/2018   Health education/counseling 09/12/2018   HTN (hypertension) with goal to be determined 04/14/2018   Refuses treatment- BP and Chol meds 04/14/2018   Hyperlipidemia 04/14/2018   Noncompliance 04/14/2018   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   Vitamin D  deficiency 05/19/2016   Family history of hypertension 04/29/2016   Family history of hyperlipidemia 04/29/2016   Family history of colonic polyps 04/29/2016    Orientation RESPIRATION BLADDER Height & Weight     Self, Time, Situation, Place  Normal Continent Weight: 120 lb (54.4 kg) Height:  5' 7 (170.2 cm)  BEHAVIORAL SYMPTOMS/MOOD NEUROLOGICAL BOWEL NUTRITION STATUS      Continent Diet (see discharge summary)  AMBULATORY STATUS COMMUNICATION OF NEEDS Skin   Limited Assist Verbally Surgical wounds                       Personal Care Assistance Level of Assistance  Bathing, Feeding, Dressing Bathing Assistance: Maximum assistance Feeding assistance: Limited assistance Dressing Assistance: Maximum assistance      Functional Limitations Info  Sight, Hearing, Speech Sight Info: Adequate Hearing Info: Adequate Speech Info: Adequate    SPECIAL CARE FACTORS FREQUENCY  PT (By licensed PT), OT (By licensed OT)     PT Frequency: 5x week OT Frequency: 5x week            Contractures Contractures Info: Not present    Additional Factors Info  Code Status, Allergies Code Status Info: full Allergies Info: NKA           Current Medications (06/20/2024):  This is the current hospital active medication list Current Facility-Administered Medications  Medication Dose Route Frequency Provider Last Rate Last Admin   acetaminophen  (TYLENOL ) tablet 650 mg  650 mg Oral Q6H McClung, Sarah A, PA-C       Or   acetaminophen  (TYLENOL ) suppository 650 mg  650 mg Rectal Q6H McClung, Sarah A, PA-C       aspirin  chewable tablet 81 mg  81 mg Oral BID Danton Lauraine LABOR, PA-C   81 mg at 06/20/24 1018   bisacodyl  (DULCOLAX) EC tablet 5 mg  5 mg Oral Daily PRN Danton Lauraine LABOR, PA-C       Chlorhexidine  Gluconate Cloth 2 % PADS 6 each  6 each Topical Daily Danton Lauraine LABOR, PA-C   6 each at 06/20/24 1018   cholecalciferol  (VITAMIN D3) 25 MCG (1000 UNIT) tablet 1,000 Units  1,000 Units Oral Daily Danton Lauraine A, PA-C   1,000 Units at 06/20/24 1018   diphenhydrAMINE  (BENADRYL ) 12.5 MG/5ML elixir 12.5-25 mg  12.5-25  mg Oral Q4H PRN Danton Lauraine LABOR, PA-C       docusate sodium  (COLACE) capsule 100 mg  100 mg Oral BID Danton Lauraine LABOR, PA-C   100 mg at 06/20/24 1018   hydrALAZINE  (APRESOLINE ) injection 10 mg  10 mg Intravenous Q6H PRN Danton Lauraine LABOR, PA-C   10 mg at 06/19/24 0512   HYDROmorphone  (DILAUDID ) injection 0.5-1 mg  0.5-1 mg Intravenous Q3H PRN Danton Lauraine LABOR, PA-C       ibuprofen  (ADVIL ) tablet 800 mg  800 mg Oral Q6H PRN Danton Lauraine LABOR, PA-C   800 mg at 06/20/24 9246   menthol  (CEPACOL) lozenge 3 mg  1 lozenge Oral PRN Danton Lauraine LABOR, PA-C       Or   phenol (CHLORASEPTIC) mouth spray 1 spray  1 spray  Mouth/Throat PRN Danton Lauraine LABOR, PA-C       methocarbamol  (ROBAXIN ) tablet 500 mg  500 mg Oral Q8H PRN Danton, Sarah A, PA-C       Or   methocarbamol  (ROBAXIN ) injection 500 mg  500 mg Intravenous Q8H PRN Danton Lauraine LABOR, PA-C       metoCLOPramide  (REGLAN ) tablet 5-10 mg  5-10 mg Oral Q8H PRN Danton Lauraine LABOR, PA-C   10 mg at 06/19/24 8042   Or   metoCLOPramide  (REGLAN ) injection 5-10 mg  5-10 mg Intravenous Q8H PRN Danton Lauraine LABOR, PA-C       mupirocin  ointment (BACTROBAN ) 2 % 1 Application  1 Application Nasal BID Danton Lauraine LABOR, PA-C   1 Application at 06/20/24 1019   ondansetron  (ZOFRAN ) tablet 4 mg  4 mg Oral Q6H PRN Danton Lauraine LABOR, PA-C       Or   ondansetron  (ZOFRAN ) injection 4 mg  4 mg Intravenous Q6H PRN Danton Lauraine LABOR, PA-C   4 mg at 06/19/24 1625   oxyCODONE  (Oxy IR/ROXICODONE ) immediate release tablet 2.5-5 mg  2.5-5 mg Oral Q4H PRN Danton Lauraine LABOR, PA-C       rosuvastatin  (CRESTOR ) tablet 10 mg  10 mg Oral q AM McClung, Sarah A, PA-C   10 mg at 06/20/24 0618   scopolamine  (TRANSDERM-SCOP) 1 MG/3DAYS 1 mg  1 patch Transdermal Q72H Danton Lauraine LABOR, PA-C   1 mg at 06/19/24 9274     Discharge Medications: Please see discharge summary for a list of discharge medications.  Relevant Imaging Results:  Relevant Lab Results:   Additional Information SSN: [id number]  Bridget Cordella Simmonds, LCSW     "

## 2024-06-20 NOTE — Progress Notes (Signed)
 Orthopaedic Trauma Progress Note  SUBJECTIVE: Doing okay this morning.  Pain controlled at rest. Worked with OT yesterday afternoon but session limited by post-op grogginess.  About to work with PT/OT now. Denies any numbness or tingling in the leg. No chest pain. No SOB. No nausea/vomiting. No other complaints.  Patient lives at home with her husband at baseline. She is his caregiver as he is wheelchair-bound due to previous strokes. They do have some home health assistance for a few hours during the day.  OBJECTIVE:  Vitals:   06/20/24 0529 06/20/24 0717  BP: 133/74 137/76  Pulse: 93 77  Resp: 16 16  Temp: 98.5 F (36.9 C) 98.1 F (36.7 C)  SpO2: 99% 98%    Opiates Today (MME): Today's  total administered Morphine  Milligram Equivalents: 0 Opiates Yesterday (MME): Yesterday's total administered Morphine  Milligram Equivalents: 75  General: Sitting up in bed, no acute distress.  Pleasant and cooperative Respiratory: No increased work of breathing.  Operative Extremity (LLE): Dressing to anterior hip is CDI. Mild soreness about the hip as expected but otherwise no significant tenderness throughout remainder of leg. Ankle DF/PF intact. Endorses sensation over the lateral thigh as well as all aspects of the foot/ankle distally. +DP pulse  IMAGING: Stable post op imaging left hip.   LABS:  Results for orders placed or performed during the hospital encounter of 06/17/24 (from the past 24 hours)  CBC     Status: Abnormal   Collection Time: 06/20/24  6:07 AM  Result Value Ref Range   WBC 15.1 (H) 4.0 - 10.5 K/uL   RBC 4.05 3.87 - 5.11 MIL/uL   Hemoglobin 12.5 12.0 - 15.0 g/dL   HCT 64.0 (L) 63.9 - 53.9 %   MCV 88.6 80.0 - 100.0 fL   MCH 30.9 26.0 - 34.0 pg   MCHC 34.8 30.0 - 36.0 g/dL   RDW 86.6 88.4 - 84.4 %   Platelets 163 150 - 400 K/uL   nRBC 0.0 0.0 - 0.2 %  Basic metabolic panel     Status: Abnormal   Collection Time: 06/20/24  6:07 AM  Result Value Ref Range   Sodium 139  135 - 145 mmol/L   Potassium 4.0 3.5 - 5.1 mmol/L   Chloride 104 98 - 111 mmol/L   CO2 26 22 - 32 mmol/L   Glucose, Bld 113 (H) 70 - 99 mg/dL   BUN 20 8 - 23 mg/dL   Creatinine, Ser 9.24 0.44 - 1.00 mg/dL   Calcium  9.1 8.9 - 10.3 mg/dL   GFR, Estimated >39 >39 mL/min   Anion gap 9 5 - 15    ASSESSMENT: Ashley Gutierrez is a 74 y.o. female, 1 Day Post-Op s/p ground-level fall Procedures: LEFT TOTAL HIP ARTHROPLASTY FOR FRACTURE, ANTERIOR APPROACH  CV/Blood loss: Acute blood loss anemia, Hgb 12.5 this AM. Hemodynamically stable  PLAN: Weightbearing: WBAT LLE ROM: Unrestricted ROM. No hip precautions needed  Incisional and dressing care: Dressings left intact until follow-up  Showering: Aquacel dressing okay to get wet when showering Orthopedic device(s): None  Pain management:  1. Tylenol  650 mg q 6 hours  2. Robaxin  500 mg q 8 hours PRN 3. Oxycodone  2.5 mg q 4 hours PRN 4. Dilaudid  0.5-1 mg q 3 hours PRN VTE prophylaxis: Aspirin , SCDs ID:  Ancef  2gm post op Foley/Lines:  No foley, KVO IVFs Impediments to Fracture Healing: Vit D level 25, continue home dose supplementation Dispo: PT/OT evaluation ongoing, OT recommendation from 1/5 was SNF but  this may change today. Dispo pending. Ortho issues sta  D/C recommendations: - Tylenol , Robaxin , oxycodone  for pain control - Aspirin  81 mg twice daily x 30 days for DVT prophylaxis - Continue home dose 1000 units Vit D supplementation  Follow - up plan: 2 weeks after d/c for wound check and repeat x-rays   Contact information:  Franky Light MD, Ashley Moores PA-C. After hours and holidays please check [email or web address] for group call information for [facility]   Ashley PATRIC Moores, PA-C [phone] (office) [email or web address]

## 2024-06-20 NOTE — Progress Notes (Signed)
 Occupational Therapy Treatment Patient Details Name: Ashley Gutierrez MRN: [id number] DOB: 1951-05-15 Today's Date: 06/20/2024   History of present illness 74 yo F adm 06/17/24 after fall with Left femoral fx. 1/5 Lt anterior THA. PMHx: HTN, HLD.   OT comments  Pt progressing towards goals this session, tx focused on progressing functional mobility OOB and increasing independence in ADL tasks. Pt required Min A +2 for safety this session for functional transfers. Up to Max A provided for ADL toileting tasks this session. OT to continue to follow Pt acutely to facilitate progress towards goals. At this time, current d/c recommendation remains appropriate but remain hopeful for continued Pt progress.       If plan is discharge home, recommend the following:  A lot of help with walking and/or transfers;A little help with bathing/dressing/bathroom;Assistance with cooking/housework;Direct supervision/assist for medications management;Assist for transportation;Help with stairs or ramp for entrance   Equipment Recommendations  Other (comment) (defer)    Recommendations for Other Services      Precautions / Restrictions Precautions Precautions: Fall Recall of Precautions/Restrictions: Impaired Precaution/Restrictions Comments: No hip precautions Restrictions Weight Bearing Restrictions Per Provider Order: Yes LLE Weight Bearing Per Provider Order: Weight bearing as tolerated       Mobility Bed Mobility Overal bed mobility: Needs Assistance Bed Mobility: Supine to Sit     Supine to sit: Min assist, HOB elevated, Used rails     General bed mobility comments: Min A for management of LLE and trunk rotation to come to EOB.    Transfers Overall transfer level: Needs assistance Equipment used: Rolling walker (2 wheels) Transfers: Sit to/from Stand Sit to Stand: Min assist, +2 safety/equipment           General transfer comment: Min A +2 for safety this session. Pt required multimodal  cues for sequencing steps with RW. Decreased activity tolerance and endurance during ambulation.     Balance Overall balance assessment: Needs assistance Sitting-balance support: Feet supported, Single extremity supported Sitting balance-Leahy Scale: Fair Sitting balance - Comments: Close supervision in sitting   Standing balance support: Bilateral upper extremity supported, During functional activity, Reliant on assistive device for balance Standing balance-Leahy Scale: Poor Standing balance comment: Dependent on RW and external support                           ADL either performed or assessed with clinical judgement   ADL Overall ADL's : Needs assistance/impaired                     Lower Body Dressing: Maximal assistance   Toilet Transfer: Minimal assistance;Stand-pivot;BSC/3in1;Rolling walker (2 wheels) Toilet Transfer Details (indicate cue type and reason): BSC brought to Pt in standing Toileting- Clothing Manipulation and Hygiene: Maximal assistance Toileting - Clothing Manipulation Details (indicate cue type and reason): Max A anterior peri care in standing            Extremity/Trunk Assessment Upper Extremity Assessment Upper Extremity Assessment: Defer to OT evaluation   Lower Extremity Assessment Lower Extremity Assessment: LLE deficits/detail LLE Deficits / Details: S/p THA. Able to perform quad set, unable to perform heel slide   Cervical / Trunk Assessment Cervical / Trunk Assessment: Normal    Vision       Perception     Praxis     Communication Communication Communication: No apparent difficulties   Cognition Arousal: Alert Behavior During Therapy: Flat affect Cognition: Cognition impaired  Awareness: Online awareness impaired Memory impairment (select all impairments): Working memory Attention impairment (select first level of impairment): Selective attention Executive functioning impairment (select all impairments):  Organization, Sequencing, Reasoning, Problem solving OT - Cognition Comments: Difficult to assess Pt cognition, alert and oriented to self, place, and time but difficulty reasoning and problem solving. Pt appeared confused for brief moments during session. NT requested assistance after session as Pt told the NT that she could not get to the [facility] with her support, despite just getting on it with therapy. OT entered Pt room and informed her that she could get up with NT.                 Following commands: Impaired Following commands impaired: Follows one step commands with increased time      Cueing   Cueing Techniques: Verbal cues, Visual cues  Exercises      Shoulder Instructions       General Comments VSS on RA.    Pertinent Vitals/ Pain       Pain Assessment Pain Assessment: Faces Faces Pain Scale: Hurts little more Pain Location: L hip with movement Pain Descriptors / Indicators: Discomfort, Guarding, Grimacing Pain Intervention(s): Limited activity within patient's tolerance, Monitored during session, Premedicated before session  Home Living Family/patient expects to be discharged to:: Private residence Living Arrangements: Spouse/significant other (husband immobile) Available Help at Discharge: Family;Available PRN/intermittently (son can help PRN) Type of Home: House Home Access: Stairs to enter [employer] of Steps: 3 or 4 Entrance Stairs-Rails: Left Home Layout: One level     Bathroom Shower/Tub: Door   [employer] Toilet: Handicapped height     Home Equipment: Agricultural [employer]ant (2 wheels);Cane - quad;BSC/3in1;Shower seat;Grab bars - toilet;Grab bars - tub/shower;Hand held shower head   Additional Comments: Husband is dependent on Pt and caregiver      Prior Functioning/Environment              Frequency  Min 2X/week        Progress Toward Goals  OT Goals(current goals can now be found in the care plan section)  Progress towards OT  goals: Progressing toward goals  Acute Rehab OT Goals Patient Stated Goal: none stated this session OT Goal Formulation: With patient Time For Goal Achievement: 07/03/24 Potential to Achieve Goals: Good ADL Goals Pt Will Perform Lower Body Bathing: with min assist;with adaptive equipment Pt Will Perform Lower Body Dressing: with min assist;with adaptive equipment Pt Will Transfer to Toilet: with modified independence;bedside commode;stand pivot transfer Pt/caregiver will Perform Home Exercise Program: Increased strength;Both right and left upper extremity;With written HEP provided Additional ADL Goal #1: Pt will engage in bed mobility with Min A to come to EOB as a precursor to ADL tasks OOB.  Plan      Co-evaluation    PT/OT/SLP Co-Evaluation/Treatment: Yes Reason for Co-Treatment: Necessary to address cognition/behavior during functional activity;For patient/therapist safety;To address functional/ADL transfers   OT goals addressed during session: ADL's and self-care;Proper use of Adaptive equipment and DME      AM-PAC OT 6 Clicks Daily Activity     Outcome Measure   Help from another person eating meals?: A Little Help from another person taking care of personal grooming?: A Little Help from another person toileting, which includes using toliet, bedpan, or urinal?: A Lot Help from another person bathing (including washing, rinsing, drying)?: A Lot Help from another person to put on and taking off regular upper body clothing?: A Little Help from another person to  put on and taking off regular lower body clothing?: A Lot 6 Click Score: 15    End of Session Equipment Utilized During Treatment: Gait belt;Rolling walker (2 wheels)  OT Visit Diagnosis: Muscle weakness (generalized) (M62.81);History of falling (Z91.81);Pain   Activity Tolerance Patient tolerated treatment well   Patient Left in chair;with call bell/phone within reach;with chair alarm set   Nurse  Communication Mobility status        Time: 9154-9083 OT Time Calculation (min): 31 min  Charges: OT General Charges $OT Visit: 1 Visit OT Treatments $Self Care/Home Management : 8-22 mins  Ashley CROME, OTR/L.  [facility] Acute Rehabilitation  Office: [phone]   Ashley Gutierrez 06/20/2024, 12:05 PM

## 2024-06-21 DIAGNOSIS — S72042A Displaced fracture of base of neck of left femur, initial encounter for closed fracture: Secondary | ICD-10-CM | POA: Diagnosis not present

## 2024-06-21 LAB — CBC
HCT: 34.7 % — ABNORMAL LOW (ref 36.0–46.0)
Hemoglobin: 11.9 g/dL — ABNORMAL LOW (ref 12.0–15.0)
MCH: 30.8 pg (ref 26.0–34.0)
MCHC: 34.3 g/dL (ref 30.0–36.0)
MCV: 89.9 fL (ref 80.0–100.0)
Platelets: 152 K/uL (ref 150–400)
RBC: 3.86 MIL/uL — ABNORMAL LOW (ref 3.87–5.11)
RDW: 13.3 % (ref 11.5–15.5)
WBC: 11.6 K/uL — ABNORMAL HIGH (ref 4.0–10.5)
nRBC: 0 % (ref 0.0–0.2)

## 2024-06-21 MED ORDER — VITAMIN D3 25 MCG PO TABS: 1000.0000 [IU] | ORAL_TABLET | Freq: Every day | ORAL | Status: AC

## 2024-06-21 MED ORDER — ASPIRIN 81 MG PO TBEC: 81.0000 mg | DELAYED_RELEASE_TABLET | Freq: Two times a day (BID) | ORAL | Status: DC

## 2024-06-21 MED ORDER — OXYCODONE HCL 5 MG PO TABS: 2.5000 mg | ORAL_TABLET | ORAL | 0 refills | Status: AC | PRN

## 2024-06-21 MED ORDER — ACETAMINOPHEN 325 MG PO TABS: 650.0000 mg | ORAL_TABLET | Freq: Four times a day (QID) | ORAL | Status: AC | PRN

## 2024-06-21 MED ORDER — ASPIRIN 81 MG PO TBEC: 81.0000 mg | DELAYED_RELEASE_TABLET | Freq: Two times a day (BID) | ORAL | 0 refills | Status: AC

## 2024-06-21 MED ORDER — DOCUSATE SODIUM 100 MG PO CAPS: 100.0000 mg | ORAL_CAPSULE | Freq: Two times a day (BID) | ORAL | Status: AC

## 2024-06-21 MED ORDER — ONDANSETRON HCL 4 MG PO TABS: 4.0000 mg | ORAL_TABLET | Freq: Four times a day (QID) | ORAL | Status: AC | PRN

## 2024-06-21 MED ORDER — IBUPROFEN 800 MG PO TABS: 800.0000 mg | ORAL_TABLET | Freq: Four times a day (QID) | ORAL | Status: AC | PRN

## 2024-06-21 MED ORDER — METHOCARBAMOL 500 MG PO TABS: 500.0000 mg | ORAL_TABLET | Freq: Three times a day (TID) | ORAL | Status: AC | PRN

## 2024-06-21 MED ORDER — BISACODYL 5 MG PO TBEC: 5.0000 mg | DELAYED_RELEASE_TABLET | Freq: Every day | ORAL | Status: AC | PRN

## 2024-06-21 NOTE — Progress Notes (Signed)
 Orthopaedic Trauma Progress Note  SUBJECTIVE: Doing okay this morning.  Pain controlled at rest. Pain has been controlled with Tylenol  and Ibuprofen  post op. Denies any numbness or tingling in the leg. No chest pain. No SOB. No nausea/vomiting. No other complaints.   OBJECTIVE:  Vitals:   06/21/24 0400 06/21/24 0729  BP: (!) 173/96 (!) 159/82  Pulse: 99 74  Resp: 20 18  Temp: 98.2 F (36.8 C) 98.8 F (37.1 C)  SpO2: 98% 98%    Opiates Today (MME): Today's  total administered Morphine  Milligram Equivalents: 0 Opiates Yesterday (MME): Yesterday's total administered Morphine  Milligram Equivalents: 0  General: Sitting up in bed, no acute distress.  Pleasant and cooperative Respiratory: No increased work of breathing.  Operative Extremity (LLE): Dressing to anterior hip is CDI. Mild soreness about the hip as expected but otherwise no significant tenderness throughout remainder of leg. Ankle DF/PF intact. Endorses sensation over the lateral thigh as well as all aspects of the foot/ankle distally. +DP pulse  IMAGING: Stable post op imaging left hip.   LABS:  Results for orders placed or performed during the hospital encounter of 06/17/24 (from the past 24 hours)  CBC     Status: Abnormal   Collection Time: 06/21/24  5:07 AM  Result Value Ref Range   WBC 11.6 (H) 4.0 - 10.5 K/uL   RBC 3.86 (L) 3.87 - 5.11 MIL/uL   Hemoglobin 11.9 (L) 12.0 - 15.0 g/dL   HCT 65.2 (L) 63.9 - 53.9 %   MCV 89.9 80.0 - 100.0 fL   MCH 30.8 26.0 - 34.0 pg   MCHC 34.3 30.0 - 36.0 g/dL   RDW 86.6 88.4 - 84.4 %   Platelets 152 150 - 400 K/uL   nRBC 0.0 0.0 - 0.2 %    ASSESSMENT: Ashley Gutierrez is a 74 y.o. female, 2 Days Post-Op s/p ground-level fall Procedures: LEFT TOTAL HIP ARTHROPLASTY FOR FRACTURE, ANTERIOR APPROACH  CV/Blood loss: Acute blood loss anemia, Hgb 12.5 this AM. Hemodynamically stable  PLAN: Weightbearing: WBAT LLE ROM: Unrestricted ROM. No hip precautions needed  Incisional and  dressing care: Dressings left intact until follow-up  Showering: Aquacel dressing okay to get wet when showering Orthopedic device(s): None  Pain management:  1. Tylenol  650 mg q 6 hours  2. Robaxin  500 mg q 8 hours PRN 3. Oxycodone  2.5 mg q 4 hours PRN 4. Dilaudid  0.5-1 mg q 3 hours PRN VTE prophylaxis: Aspirin , SCDs ID:  Ancef  2gm post op Foley/Lines:  No foley, KVO IVFs Impediments to Fracture Healing: Vit D level 25, continue supplementation Dispo: PT/OT evaluation ongoing, recommending SNF. Patient agreeable. Ortho issues stable. Ok for d/c from ortho standpoint  D/C recommendations: - Tylenol , Robaxin , oxycodone  for pain control - Aspirin  81 mg twice daily x 30 days for DVT prophylaxis - Continue 1000 units Vit D supplementation  Follow - up plan: 2 weeks after d/c for wound check and repeat x-rays   Contact information:  Franky Light MD, Lauraine Moores PA-C. After hours and holidays please check [email or web address] for group call information for [facility]   Lauraine PATRIC Moores, PA-C [phone] (office) [email or web address]

## 2024-06-21 NOTE — TOC Progression Note (Addendum)
 Transition of Care [facility]) - Progression Note    Patient Details  Name: Ashley Gutierrez MRN: [id number] Date of Birth: 07-21-1950  [facility] of [facility]) CM/SW Contact  Bridget Cordella Simmonds, LCSW Phone Number: 06/21/2024, 8:35 AM  Clinical Narrative:   CSW confirmed with [facility]: they can receive pt today.  MD notified.   CSW spoke with pt regarding transportation: her son will be able to pick her up in 1-2 hours.     Expected Discharge Plan: Skilled Nursing Facility Barriers to Discharge: Continued Medical Work up, SNF Pending bed offer               Expected Discharge Plan and Services In-house Referral: Clinical Social Work   Post Acute Care Choice: Skilled Nursing Facility Living arrangements for the past 2 months: Single Family Home                                       Social Drivers of Health (SDOH) Interventions SDOH Screenings   Food Insecurity: No Food Insecurity (06/18/2024)  Housing: Low Risk (06/18/2024)  Transportation Needs: No Transportation Needs (06/18/2024)  Utilities: Not At Risk (06/18/2024)  Social Connections: Socially Isolated (06/18/2024)  Tobacco Use: Low Risk (06/19/2024)    Readmission Risk Interventions     No data to display

## 2024-06-21 NOTE — Progress Notes (Signed)
 Patient discharged, Important Message Letter mailed to patient.

## 2024-06-21 NOTE — Hospital Course (Addendum)
 SABRA

## 2024-06-21 NOTE — Progress Notes (Signed)
 Patient Details  Name: Ashley Gutierrez MRN: [id number] Date of Birth: 07/04/1950  Pt discharging to [facility] room 708B. AVS reviewed with patient and report called to [facility] RN.  Waiting on family to transport.

## 2024-06-21 NOTE — Care Management Important Message (Signed)
 Important Message  Patient Details  Name: Ashley Gutierrez MRN: [id number] Date of Birth: 1950/07/16   Important Message Given:  No     Jennie Laneta Dragon 06/21/2024, 1:33 PM

## 2024-06-21 NOTE — TOC Transition Note (Signed)
 Transition of Care [facility] - Discharge Note   Patient Details  Name: Ashley Gutierrez MRN: [id number] Date of Birth: [birth year]  Transition of Care [facility]) CM/SW Contact:  Bridget Cordella Simmonds, LCSW Phone Number: 06/21/2024, 9:56 AM   Clinical Narrative:   Pt discharging to [facility] room 708B.  RN call report to [phone].  Pt son Ethel will transport pt, will need pt brought down to main north tower entrance with assistance getting into the vehicle.     Final next level of care: Skilled Nursing Facility Barriers to Discharge: Barriers Resolved   Patient Goals and CMS Choice Patient states their goals for this hospitalization and ongoing recovery are:: back to normal   Choice offered to / list presented to : Patient (pt requesting [facility])      Discharge Placement              Patient chooses bed at: [facility] [place] Patient to be transferred to facility by: son Johnathon Name of family member notified: son Johnathon Patient and family notified of of transfer: 06/21/24  Discharge Plan and Services Additional resources added to the After Visit Summary for   In-house Referral: Clinical Social Work   Post Acute Care Choice: Skilled Nursing Facility                               Social Drivers of Health (SDOH) Interventions SDOH Screenings   Food Insecurity: No Food Insecurity (06/18/2024)  Housing: Low Risk (06/18/2024)  Transportation Needs: No Transportation Needs (06/18/2024)  Utilities: Not At Risk (06/18/2024)  Social Connections: Socially Isolated (06/18/2024)  Tobacco Use: Low Risk (06/19/2024)     Readmission Risk Interventions     No data to display

## 2024-06-21 NOTE — Plan of Care (Signed)
" °  Problem: Education: Goal: Knowledge of General Education information will improve Description: Including pain rating scale, medication(s)/side effects and non-pharmacologic comfort measures Outcome: Adequate for Discharge   Problem: Health Behavior/Discharge Planning: Goal: Ability to manage health-related needs will improve Outcome: Adequate for Discharge   Problem: Clinical Measurements: Goal: Ability to maintain clinical measurements within normal limits will improve Outcome: Adequate for Discharge Goal: Will remain free from infection Outcome: Adequate for Discharge Goal: Diagnostic test results will improve Outcome: Adequate for Discharge Goal: Respiratory complications will improve Outcome: Adequate for Discharge Goal: Cardiovascular complication will be avoided Outcome: Adequate for Discharge   Problem: Activity: Goal: Risk for activity intolerance will decrease Outcome: Adequate for Discharge   Problem: Nutrition: Goal: Adequate nutrition will be maintained Outcome: Adequate for Discharge   Problem: Coping: Goal: Level of anxiety will decrease Outcome: Adequate for Discharge   Problem: Elimination: Goal: Will not experience complications related to bowel motility Outcome: Adequate for Discharge Goal: Will not experience complications related to urinary retention Outcome: Adequate for Discharge   Problem: Pain Managment: Goal: General experience of comfort will improve and/or be controlled Outcome: Adequate for Discharge   Problem: Safety: Goal: Ability to remain free from injury will improve Outcome: Adequate for Discharge   Problem: Skin Integrity: Goal: Risk for impaired skin integrity will decrease Outcome: Adequate for Discharge   Problem: Education: Goal: Knowledge of the prescribed therapeutic regimen will improve Outcome: Adequate for Discharge   Problem: Bowel/Gastric: Goal: Gastrointestinal status for postoperative course will  improve Outcome: Adequate for Discharge   Problem: Cardiac: Goal: Ability to maintain an adequate cardiac output Outcome: Adequate for Discharge Goal: Will show no evidence of cardiac arrhythmias Outcome: Adequate for Discharge   Problem: Nutritional: Goal: Will attain and maintain optimal nutritional status Outcome: Adequate for Discharge   Problem: Neurological: Goal: Will regain or maintain usual level of consciousness Outcome: Adequate for Discharge   Problem: Clinical Measurements: Goal: Ability to maintain clinical measurements within normal limits Outcome: Adequate for Discharge Goal: Postoperative complications will be avoided or minimized Outcome: Adequate for Discharge   Problem: Respiratory: Goal: Will regain and/or maintain adequate ventilation Outcome: Adequate for Discharge Goal: Respiratory status will improve Outcome: Adequate for Discharge   Problem: Skin Integrity: Goal: Demonstrates signs of wound healing without infection Outcome: Adequate for Discharge   Problem: Urinary Elimination: Goal: Will remain free from infection Outcome: Adequate for Discharge Goal: Ability to achieve and maintain adequate urine output Outcome: Adequate for Discharge   Problem: Education: Goal: Knowledge of the prescribed therapeutic regimen will improve Outcome: Adequate for Discharge Goal: Understanding of discharge needs will improve Outcome: Adequate for Discharge Goal: Individualized Educational Video(s) Outcome: Adequate for Discharge   Problem: Activity: Goal: Ability to avoid complications of mobility impairment will improve Outcome: Adequate for Discharge Goal: Ability to tolerate increased activity will improve Outcome: Adequate for Discharge   Problem: Clinical Measurements: Goal: Postoperative complications will be avoided or minimized Outcome: Adequate for Discharge   Problem: Pain Management: Goal: Pain level will decrease with appropriate  interventions Outcome: Adequate for Discharge   Problem: Skin Integrity: Goal: Will show signs of wound healing Outcome: Adequate for Discharge   "

## 2024-06-21 NOTE — Discharge Summary (Signed)
 "  Physician Discharge Summary  Ashley Gutierrez FMW:[id number] DOB: 1951-01-18 DOA: 06/17/2024  PCP: Larnell Hamilton, MD  Admit date: 06/17/2024 Discharge date: 06/21/2024  Admitted From: Home  Discharge disposition: Skilled nursing facility   Recommendations for Outpatient Follow-Up:   Follow up with your primary care provider or nursing facility in 3 to 5 days Check CBC, BMP, magnesium in the next visit Follow-up with Dr. Kendal orthopedics in 2 weeks for x-rays and wound check.  Discharge Diagnosis:   Principal Problem:   Femoral fracture (HCC)   Discharge Condition: Improved.  Diet recommendation:   Regular.  Wound care: None.  Reinforce dressing if needed  Code status: Full.   History of Present Illness:  Ashley Gutierrez is a 74 y.o. female with past medical history significant of osteoporosis, hyperlipidemia presented to the hospital with mechanical fall at home without loss of consciousness dizziness or lightheadedness prior to the fall.  She was unable to ambulate so EMS was called in.  In the ED Gutierrez was hemodynamically stable.  X-ray of the left hip showed moderately displaced proximal left femoral neck fracture and orthopedics was consulted.  Gutierrez was then admitted hospital for further evaluation and treatment.   Hospital Course:   Following conditions were addressed during hospitalization as listed below,  Left femoral neck fracture status post fall at home/history of osteoporosis:  Left hip x-ray showed moderately displaced proximal left femoral neck fracture.  Orthopedics was consulted and Gutierrez underwent hip arthroplasty on 06/20/2023.  PT OT has recommended skilled nursing facility placement for rehabilitation.  Postoperative anemia.  Hemoglobin drop noted to 11.9 from initial 14.2.  No need for blood transfusion at this time will closely monitor.  Leukocytosis likely reactive.  Recommend outpatient follow-up.  Elevated blood pressure: No history of  hypertension.  On as needed hydralazine .  Could be secondary to to pain.  Will continue to monitor.    Hyperlipidemia: Continue Crestor    Vitamin D  deficiency: Continue vitamin D  supplementation.  Disposition.  At this time, Gutierrez is stable for disposition  Medical Consultants:   None.  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  Subjective:   Today, Gutierrez was seen and examined at bedside.  Complains of mild hip discomfort.  Denies any shortness of breath dyspnea chest pain fever chills or rigor.  Discharge Exam:   Vitals:   06/21/24 0400 06/21/24 0729  BP: (!) 173/96 (!) 159/82  Pulse: 99 74  Resp: 20 18  Temp: 98.2 F (36.8 C) 98.8 F (37.1 C)  SpO2: 98% 98%   Vitals:   06/20/24 1414 06/20/24 2034 06/21/24 0400 06/21/24 0729  BP: 132/64 131/63 (!) 173/96 (!) 159/82  Pulse: 97 81 99 74  Resp: 16 16 20 18   Temp: 98.9 F (37.2 C) 98 F (36.7 C) 98.2 F (36.8 C) 98.8 F (37.1 C)  TempSrc: Oral   Oral  SpO2: 99% 98% 98% 98%  Weight:      Height:       Body mass index is 18.79 kg/m.  General: Alert awake, not in obvious distress HENT: pupils equally reacting to light,  No scleral pallor or icterus noted. Oral mucosa is moist.  Chest: Diminished breath sounds bilaterally. CVS: S1 &S2 heard. No murmur.  Regular rate and rhythm. Abdomen: Soft, nontender, nondistended.  Bowel sounds are heard.   Extremities: No cyanosis, clubbing or edema.  Peripheral pulses are palpable.  Left hip with dressing. Psych: Alert, awake and oriented, normal mood CNS:  No cranial nerve deficits.  Power equal in all extremities.   Skin: Warm and dry.  No rashes noted.  The results of significant diagnostics from this hospitalization (including imaging, microbiology, ancillary and laboratory) are listed below for reference.     Diagnostic Studies:   DG Knee Left Port Result Date: 06/18/2024 EXAM: 1 or 2 VIEW(S) XRAY OF THE KNEE 06/18/2024 01:58:00 PM COMPARISON: None  available. CLINICAL HISTORY: Hip fracture [facility]) [phone] FINDINGS: BONES AND JOINTS: No acute fracture. No malalignment. No significant joint effusion. No significant degenerative changes. SOFT TISSUES: The soft tissues are unremarkable. IMPRESSION: 1. No acute findings. Electronically signed by: Norman Gatlin MD 06/18/2024 02:06 PM EST RP Workstation: [id number]   DG Hip Unilat W or Wo Pelvis 2-3 Views Left Result Date: 06/17/2024 CLINICAL DATA:  Left hip injury after fall EXAM: DG HIP (WITH OR WITHOUT PELVIS) 2-3V LEFT COMPARISON:  None Available. FINDINGS: Moderately displaced proximal left femoral neck fracture is noted. IMPRESSION: Moderately displaced proximal left femoral neck fracture. Electronically Signed   By: Lynwood Landy Raddle M.D.   On: 06/17/2024 17:48     Labs:   Basic Metabolic Panel: Recent Labs  Lab 06/17/24 1716 06/18/24 0500 06/19/24 0242 06/20/24 0607  NA 142 140 141 139  K 3.6 3.9 3.8 4.0  CL 104 104 104 104  CO2 25 26 26 26   GLUCOSE 132* 110* 101* 113*  BUN 13 9 11 20   CREATININE 0.70 0.63 0.69 0.75  CALCIUM  9.2 9.2 9.2 9.1   GFR Estimated Creatinine Clearance: 53 mL/min (by C-G formula based on SCr of 0.75 mg/dL). Liver Function Tests: No results for input(s): AST, ALT, ALKPHOS, BILITOT, PROT, ALBUMIN in the last 168 hours. No results for input(s): LIPASE, AMYLASE in the last 168 hours. No results for input(s): AMMONIA in the last 168 hours. Coagulation profile No results for input(s): INR, PROTIME in the last 168 hours.  CBC: Recent Labs  Lab 06/17/24 1716 06/18/24 0500 06/19/24 0242 06/20/24 0607 06/21/24 0507  WBC 10.3 12.6* 9.3 15.1* 11.6*  NEUTROABS  --   --  7.1  --   --   HGB 13.9 14.2 15.4* 12.5 11.9*  HCT 42.4 41.3 45.0 35.9* 34.7*  MCV 91.8 89.2 90.5 88.6 89.9  PLT 246 239 226 163 152   Cardiac Enzymes: No results for input(s): CKTOTAL, CKMB, CKMBINDEX, TROPONINI in the last 168 hours. BNP: Invalid  input(s): POCBNP CBG: No results for input(s): GLUCAP in the last 168 hours. D-Dimer No results for input(s): DDIMER in the last 72 hours. Hgb A1c No results for input(s): HGBA1C in the last 72 hours. Lipid Profile No results for input(s): CHOL, HDL, LDLCALC, TRIG, CHOLHDL, LDLDIRECT in the last 72 hours. Thyroid function studies No results for input(s): TSH, T4TOTAL, T3FREE, THYROIDAB in the last 72 hours.  Invalid input(s): FREET3 Anemia work up No results for input(s): VITAMINB12, FOLATE, FERRITIN, TIBC, IRON, RETICCTPCT in the last 72 hours. Microbiology Recent Results (from the past 240 hours)  Surgical pcr screen     Status: Abnormal   Collection Time: 06/18/24  6:44 AM   Specimen: Nasal Mucosa; Nasal Swab  Result Value Ref Range Status   MRSA, PCR NEGATIVE NEGATIVE Final   Staphylococcus aureus POSITIVE (A) NEGATIVE Final    Comment: (NOTE) The Xpert SA Assay (FDA approved for NASAL specimens in patients 61 years of age and older), is one component of a comprehensive surveillance program. It is not intended to diagnose infection nor to guide or monitor  treatment. Performed at [facility], [address] [place], [place] [address]      Discharge Instructions:   Discharge Instructions     Call MD for:  redness, tenderness, or signs of infection (pain, swelling, redness, odor or green/yellow discharge around incision site)   Complete by: As directed    Call MD for:  severe uncontrolled pain   Complete by: As directed    Call MD for:  temperature >100.4   Complete by: As directed    Diet general   Complete by: As directed    Discharge instructions   Complete by: As directed    Follow-up with orthopedics in 2 weeks for x-rays and wound check..  Follow-up with primary care provider at the skilled nursing facility in 3 to 5 days.  Seek medical attention for worsening symptoms.   Increase activity slowly   Complete by:  As directed    No wound care   Complete by: As directed       Allergies as of 06/21/2024   No Known Allergies      Medication List     TAKE these medications    acetaminophen  325 MG tablet Commonly known as: TYLENOL  Take 2 tablets (650 mg total) by mouth every 6 (six) hours as needed for mild pain (pain score 1-3), fever or headache.   aspirin  EC 81 MG tablet Take 1 tablet (81 mg total) by mouth in the morning and at bedtime. Swallow whole. What changed: when to take this   bisacodyl  5 MG EC tablet Commonly known as: DULCOLAX Take 1 tablet (5 mg total) by mouth daily as needed for moderate constipation.   chlorhexidine  4 % external liquid Commonly known as: HIBICLENS  Apply 15 mLs (1 Application total) topically as directed for 30 doses. Use as directed daily for 5 days every other week for 6 weeks.   docusate sodium  100 MG capsule Commonly known as: COLACE Take 1 capsule (100 mg total) by mouth 2 (two) times daily for 5 days.   ibuprofen  800 MG tablet Commonly known as: ADVIL  Take 1 tablet (800 mg total) by mouth every 6 (six) hours as needed for mild pain (pain score 1-3) or moderate pain (pain score 4-6).   methocarbamol  500 MG tablet Commonly known as: ROBAXIN  Take 1 tablet (500 mg total) by mouth every 8 (eight) hours as needed for muscle spasms.   mupirocin  ointment 2 % Commonly known as: BACTROBAN  Place 1 Application into the nose 2 (two) times daily for 60 doses. Use as directed 2 times daily for 5 days every other week for 6 weeks.   ondansetron  4 MG tablet Commonly known as: ZOFRAN  Take 1 tablet (4 mg total) by mouth every 6 (six) hours as needed for nausea.   oxyCODONE  5 MG immediate release tablet Commonly known as: Oxy IR/ROXICODONE  Take 0.5 tablets (2.5 mg total) by mouth every 4 (four) hours as needed for severe pain (pain score 7-10).   rosuvastatin  10 MG tablet Commonly known as: CRESTOR  Take 10 mg by mouth in the morning.   vitamin D3 25 MCG  tablet Commonly known as: CHOLECALCIFEROL  Take 1 tablet (1,000 Units total) by mouth daily.        Follow-up Information     Haddix, Franky SQUIBB, MD. Schedule an appointment as soon as possible for a visit in 2 week(s).   Specialty: Orthopedic Surgery Why: for wound check and repeat x-rays Contact information: [address] [place] [address] [phone]  Time coordinating discharge: 39 minutes  Signed:  Wilkie Zenon  [facility] 06/21/2024, 9:27 AM          "

## 2024-06-21 NOTE — Progress Notes (Signed)
 Physical Therapy Treatment Patient Details Name: Ashley Gutierrez MRN: [id number] DOB: [birth year] Today's Date: 06/21/2024   History of Present Illness 74 yo F adm 06/17/24 after fall with Left femoral fx. 1/5 Lt anterior THA. PMHx: HTN, HLD.    PT Comments  Pt making steady progress towards her physical therapy goals. Reviewed and performed AROM/AAROM exercises for LLE strengthening (written handout provided). Pt ambulating 30 ft with a RW with slowed step to pattern with verbal cueing for sequencing and technique. Further distance limited due to mild dizziness, fatigue and pain. Patient will benefit from continued inpatient follow up therapy, <3 hours/day.    If plan is discharge home, recommend the following: A little help with walking and/or transfers;A little help with bathing/dressing/bathroom;Assistance with cooking/housework;Assist for transportation;Help with stairs or ramp for entrance   Can travel by private vehicle     Yes  Equipment Recommendations  Rolling walker (2 wheels);BSC/3in1    Recommendations for Other Services       Precautions / Restrictions Precautions Precautions: Fall Recall of Precautions/Restrictions: Intact Precaution/Restrictions Comments: No hip precautions Restrictions Weight Bearing Restrictions Per Provider Order: Yes LLE Weight Bearing Per Provider Order: Weight bearing as tolerated     Mobility  Bed Mobility Overal bed mobility: Needs Assistance Bed Mobility: Supine to Sit     Supine to sit: Supervision     General bed mobility comments: Increased time, HOB elevated 12 degrees    Transfers Overall transfer level: Needs assistance Equipment used: Rolling walker (2 wheels) Transfers: Sit to/from Stand Sit to Stand: Contact guard assist           General transfer comment: Verbal cues for hand placement, slow to rise    Ambulation/Gait Ambulation/Gait assistance: Contact guard assist Gait Distance (Feet): 30 Feet Assistive device:  Rolling walker (2 wheels) Gait Pattern/deviations: Step-to pattern, Decreased stance time - right, Decreased weight shift to right Gait velocity: decreased Gait velocity interpretation: <1.31 ft/sec, indicative of household ambulator   General Gait Details: Verbal cues for sequencing/technique, heel strike at initial contact, upright posture   Stairs             Wheelchair Mobility     Tilt Bed    Modified Rankin (Stroke Patients Only)       Balance Overall balance assessment: Needs assistance Sitting-balance support: Feet supported Sitting balance-Leahy Scale: Fair     Standing balance support: Bilateral upper extremity supported, Reliant on assistive device for balance Standing balance-Leahy Scale: Poor                              Communication Communication Communication: No apparent difficulties  Cognition Arousal: Alert Behavior During Therapy: WFL for tasks assessed/performed   PT - Cognitive impairments: No apparent impairments                         Following commands: Intact      Cueing Cueing Techniques: Verbal cues  Exercises General Exercises - Lower Extremity Ankle Circles/Pumps: AROM, Both, Supine, 20 reps Quad Sets: AROM, Both, 10 reps, Supine Heel Slides: AAROM, Left, 5 reps, Supine Hip ABduction/ADduction: AAROM, Left, 5 reps, Supine    General Comments        Pertinent Vitals/Pain Pain Assessment Pain Assessment: Faces Faces Pain Scale: Hurts little more Pain Location: L hip Pain Descriptors / Indicators: Discomfort Pain Intervention(s): Monitored during session    Home Living  Prior Function            PT Goals (current goals can now be found in the care plan section) Acute Rehab PT Goals Patient Stated Goal: did not state PT Goal Formulation: With patient Time For Goal Achievement: 07/04/24 Potential to Achieve Goals: Good Progress towards PT goals:  Progressing toward goals    Frequency    Min 3X/week      PT Plan      Co-evaluation              AM-PAC PT 6 Clicks Mobility   Outcome Measure  Help needed turning from your back to your side while in a flat bed without using bedrails?: A Little Help needed moving from lying on your back to sitting on the side of a flat bed without using bedrails?: A Little Help needed moving to and from a bed to a chair (including a wheelchair)?: A Little Help needed standing up from a chair using your arms (e.g., wheelchair or bedside chair)?: A Little Help needed to walk in hospital room?: A Little Help needed climbing 3-5 steps with a railing? : A Lot 6 Click Score: 17    End of Session Equipment Utilized During Treatment: Gait belt Activity Tolerance: Patient tolerated treatment well Patient left: in chair;with call bell/phone within reach;with chair alarm set Nurse Communication: Mobility status PT Visit Diagnosis: Pain;Difficulty in walking, not elsewhere classified (R26.2);Unsteadiness on feet (R26.81) Pain - Right/Left: Left Pain - part of body: Hip     Time: 9158-9097 PT Time Calculation (min) (ACUTE ONLY): 21 min  Charges:    $Gait Training: 8-22 mins PT General Charges $$ ACUTE PT VISIT: 1 Visit                     Aleck Daring, PT, DPT Acute Rehabilitation Services Office [phone]    Aleck ONEIDA Daring 06/21/2024, 9:23 AM
